# Patient Record
Sex: Female | Born: 1970 | Race: White | Hispanic: No | Smoking: Former smoker
Health system: Southern US, Community
[De-identification: ages and names within clinical notes are randomized; demographics above are authoritative.]

## PROBLEM LIST (undated history)

## (undated) DIAGNOSIS — G8929 Other chronic pain: Secondary | ICD-10-CM

## (undated) DIAGNOSIS — K589 Irritable bowel syndrome without diarrhea: Secondary | ICD-10-CM

## (undated) DIAGNOSIS — R002 Palpitations: Secondary | ICD-10-CM

## (undated) DIAGNOSIS — K3184 Gastroparesis: Secondary | ICD-10-CM

## (undated) DIAGNOSIS — Z8669 Personal history of other diseases of the nervous system and sense organs: Secondary | ICD-10-CM

## (undated) DIAGNOSIS — N2889 Other specified disorders of kidney and ureter: Secondary | ICD-10-CM

## (undated) DIAGNOSIS — M797 Fibromyalgia: Secondary | ICD-10-CM

## (undated) DIAGNOSIS — G459 Transient cerebral ischemic attack, unspecified: Secondary | ICD-10-CM

## (undated) DIAGNOSIS — F419 Anxiety disorder, unspecified: Secondary | ICD-10-CM

## (undated) DIAGNOSIS — F909 Attention-deficit hyperactivity disorder, unspecified type: Secondary | ICD-10-CM

## (undated) HISTORY — DX: Attention-deficit hyperactivity disorder, unspecified type: F90.9

## (undated) HISTORY — DX: Palpitations: R00.2

## (undated) HISTORY — DX: Fibromyalgia: M79.7

## (undated) HISTORY — PX: TUBAL LIGATION: SHX77

## (undated) HISTORY — PX: ABDOMINAL HYSTERECTOMY: SHX81

## (undated) HISTORY — PX: VESICOVAGINAL FISTULA CLOSURE W/ TAH: SUR271

## (undated) HISTORY — DX: Personal history of other diseases of the nervous system and sense organs: Z86.69

## (undated) HISTORY — DX: Gastroparesis: K31.84

## (undated) HISTORY — DX: Irritable bowel syndrome, unspecified: K58.9

## (undated) HISTORY — DX: Other chronic pain: G89.29

---

## 1998-07-11 ENCOUNTER — Encounter: Payer: Self-pay | Admitting: Family Medicine

## 1998-07-11 ENCOUNTER — Ambulatory Visit (HOSPITAL_COMMUNITY): Admission: RE | Admit: 1998-07-11 | Discharge: 1998-07-11 | Payer: Self-pay | Admitting: Family Medicine

## 1998-07-25 ENCOUNTER — Other Ambulatory Visit: Admission: RE | Admit: 1998-07-25 | Discharge: 1998-07-25 | Payer: Self-pay | Admitting: Family Medicine

## 1998-09-06 ENCOUNTER — Encounter: Payer: Self-pay | Admitting: Family Medicine

## 1998-09-06 ENCOUNTER — Ambulatory Visit (HOSPITAL_COMMUNITY): Admission: RE | Admit: 1998-09-06 | Discharge: 1998-09-06 | Payer: Self-pay | Admitting: Family Medicine

## 1998-10-04 ENCOUNTER — Other Ambulatory Visit: Admission: RE | Admit: 1998-10-04 | Discharge: 1998-10-04 | Payer: Self-pay

## 1998-10-14 ENCOUNTER — Encounter: Payer: Self-pay | Admitting: Family Medicine

## 1998-10-14 ENCOUNTER — Ambulatory Visit (HOSPITAL_COMMUNITY): Admission: RE | Admit: 1998-10-14 | Discharge: 1998-10-14 | Payer: Self-pay | Admitting: Family Medicine

## 1998-11-22 ENCOUNTER — Ambulatory Visit (HOSPITAL_COMMUNITY): Admission: RE | Admit: 1998-11-22 | Discharge: 1998-11-22 | Payer: Self-pay | Admitting: Family Medicine

## 1998-11-22 ENCOUNTER — Encounter: Payer: Self-pay | Admitting: Family Medicine

## 1998-12-03 ENCOUNTER — Inpatient Hospital Stay (HOSPITAL_COMMUNITY): Admission: RE | Admit: 1998-12-03 | Discharge: 1998-12-03 | Payer: Self-pay | Admitting: *Deleted

## 1998-12-03 ENCOUNTER — Encounter: Payer: Self-pay | Admitting: Family Medicine

## 1999-01-10 ENCOUNTER — Encounter: Payer: Self-pay | Admitting: Family Medicine

## 1999-01-10 ENCOUNTER — Ambulatory Visit (HOSPITAL_COMMUNITY): Admission: RE | Admit: 1999-01-10 | Discharge: 1999-01-10 | Payer: Self-pay | Admitting: Family Medicine

## 1999-01-13 ENCOUNTER — Inpatient Hospital Stay (HOSPITAL_COMMUNITY): Admission: AD | Admit: 1999-01-13 | Discharge: 1999-01-13 | Payer: Self-pay | Admitting: Family Medicine

## 1999-01-16 ENCOUNTER — Observation Stay (HOSPITAL_COMMUNITY): Admission: AD | Admit: 1999-01-16 | Discharge: 1999-01-17 | Payer: Self-pay | Admitting: Family Medicine

## 1999-01-23 ENCOUNTER — Inpatient Hospital Stay (HOSPITAL_COMMUNITY): Admission: AD | Admit: 1999-01-23 | Discharge: 1999-01-23 | Payer: Self-pay | Admitting: Family Medicine

## 1999-01-26 ENCOUNTER — Inpatient Hospital Stay (HOSPITAL_COMMUNITY): Admission: AD | Admit: 1999-01-26 | Discharge: 1999-01-26 | Payer: Self-pay | Admitting: *Deleted

## 1999-01-29 ENCOUNTER — Inpatient Hospital Stay (HOSPITAL_COMMUNITY): Admission: AD | Admit: 1999-01-29 | Discharge: 1999-02-01 | Payer: Self-pay

## 1999-02-10 ENCOUNTER — Inpatient Hospital Stay (HOSPITAL_COMMUNITY): Admission: AD | Admit: 1999-02-10 | Discharge: 1999-02-10 | Payer: Self-pay | Admitting: Family Medicine

## 1999-02-11 ENCOUNTER — Encounter (HOSPITAL_COMMUNITY): Admission: RE | Admit: 1999-02-11 | Discharge: 1999-05-12 | Payer: Self-pay | Admitting: Family Medicine

## 2004-08-28 ENCOUNTER — Ambulatory Visit: Payer: Self-pay | Admitting: Family Medicine

## 2004-09-03 ENCOUNTER — Ambulatory Visit: Payer: Self-pay | Admitting: Family Medicine

## 2004-10-14 ENCOUNTER — Ambulatory Visit: Payer: Self-pay | Admitting: Family Medicine

## 2004-12-02 ENCOUNTER — Ambulatory Visit: Payer: Self-pay | Admitting: Family Medicine

## 2004-12-15 ENCOUNTER — Ambulatory Visit: Payer: Self-pay | Admitting: Family Medicine

## 2004-12-18 ENCOUNTER — Ambulatory Visit (HOSPITAL_COMMUNITY): Admission: RE | Admit: 2004-12-18 | Discharge: 2004-12-18 | Payer: Self-pay | Admitting: Obstetrics and Gynecology

## 2005-03-04 ENCOUNTER — Ambulatory Visit: Payer: Self-pay | Admitting: Family Medicine

## 2005-04-20 ENCOUNTER — Encounter (INDEPENDENT_AMBULATORY_CARE_PROVIDER_SITE_OTHER): Payer: Self-pay | Admitting: *Deleted

## 2005-04-20 ENCOUNTER — Observation Stay (HOSPITAL_COMMUNITY): Admission: RE | Admit: 2005-04-20 | Discharge: 2005-04-21 | Payer: Self-pay | Admitting: Obstetrics and Gynecology

## 2005-04-26 ENCOUNTER — Inpatient Hospital Stay (HOSPITAL_COMMUNITY): Admission: AD | Admit: 2005-04-26 | Discharge: 2005-04-26 | Payer: Self-pay | Admitting: Obstetrics and Gynecology

## 2005-05-04 ENCOUNTER — Ambulatory Visit: Payer: Self-pay | Admitting: Family Medicine

## 2005-08-10 ENCOUNTER — Ambulatory Visit: Payer: Self-pay | Admitting: Family Medicine

## 2005-10-01 ENCOUNTER — Ambulatory Visit: Payer: Self-pay | Admitting: Family Medicine

## 2006-01-21 ENCOUNTER — Ambulatory Visit: Payer: Self-pay | Admitting: Family Medicine

## 2006-04-27 ENCOUNTER — Ambulatory Visit: Payer: Self-pay | Admitting: Family Medicine

## 2006-05-04 ENCOUNTER — Emergency Department (HOSPITAL_COMMUNITY): Admission: EM | Admit: 2006-05-04 | Discharge: 2006-05-04 | Payer: Self-pay | Admitting: Emergency Medicine

## 2006-06-25 ENCOUNTER — Emergency Department (HOSPITAL_COMMUNITY): Admission: EM | Admit: 2006-06-25 | Discharge: 2006-06-26 | Payer: Self-pay | Admitting: Emergency Medicine

## 2006-07-09 ENCOUNTER — Encounter: Admission: RE | Admit: 2006-07-09 | Discharge: 2006-07-09 | Payer: Self-pay | Admitting: Specialist

## 2006-08-18 ENCOUNTER — Ambulatory Visit: Payer: Self-pay | Admitting: Family Medicine

## 2006-11-25 ENCOUNTER — Ambulatory Visit: Payer: Self-pay | Admitting: Pulmonary Disease

## 2006-12-04 ENCOUNTER — Emergency Department (HOSPITAL_COMMUNITY): Admission: EM | Admit: 2006-12-04 | Discharge: 2006-12-04 | Payer: Self-pay | Admitting: Emergency Medicine

## 2007-01-15 ENCOUNTER — Emergency Department (HOSPITAL_COMMUNITY): Admission: EM | Admit: 2007-01-15 | Discharge: 2007-01-16 | Payer: Self-pay | Admitting: Emergency Medicine

## 2007-01-21 ENCOUNTER — Ambulatory Visit: Payer: Self-pay | Admitting: Pulmonary Disease

## 2007-01-26 ENCOUNTER — Encounter: Admission: RE | Admit: 2007-01-26 | Discharge: 2007-04-26 | Payer: Self-pay | Admitting: Pulmonary Disease

## 2007-02-06 ENCOUNTER — Emergency Department (HOSPITAL_COMMUNITY): Admission: EM | Admit: 2007-02-06 | Discharge: 2007-02-07 | Payer: Self-pay | Admitting: Emergency Medicine

## 2007-02-15 ENCOUNTER — Ambulatory Visit: Payer: Self-pay | Admitting: Pulmonary Disease

## 2007-03-04 ENCOUNTER — Ambulatory Visit (HOSPITAL_COMMUNITY): Admission: RE | Admit: 2007-03-04 | Discharge: 2007-03-04 | Payer: Self-pay | Admitting: Family Medicine

## 2007-03-11 ENCOUNTER — Ambulatory Visit (HOSPITAL_COMMUNITY): Admission: RE | Admit: 2007-03-11 | Discharge: 2007-03-11 | Payer: Self-pay | Admitting: Family Medicine

## 2007-03-21 ENCOUNTER — Ambulatory Visit (HOSPITAL_COMMUNITY): Admission: RE | Admit: 2007-03-21 | Discharge: 2007-03-21 | Payer: Self-pay | Admitting: Gastroenterology

## 2007-07-05 DIAGNOSIS — J45909 Unspecified asthma, uncomplicated: Secondary | ICD-10-CM | POA: Insufficient documentation

## 2007-07-05 DIAGNOSIS — J310 Chronic rhinitis: Secondary | ICD-10-CM | POA: Insufficient documentation

## 2008-03-14 ENCOUNTER — Encounter: Payer: Self-pay | Admitting: Cardiology

## 2008-03-23 ENCOUNTER — Encounter: Payer: Self-pay | Admitting: Cardiology

## 2008-07-27 ENCOUNTER — Encounter: Payer: Self-pay | Admitting: Cardiology

## 2008-09-02 ENCOUNTER — Encounter: Payer: Self-pay | Admitting: Cardiology

## 2008-10-09 ENCOUNTER — Encounter: Payer: Self-pay | Admitting: Cardiology

## 2008-10-17 ENCOUNTER — Emergency Department (HOSPITAL_COMMUNITY): Admission: EM | Admit: 2008-10-17 | Discharge: 2008-10-17 | Payer: Self-pay | Admitting: Emergency Medicine

## 2009-04-30 IMAGING — CT CT ABD-PELV W/O CM
2 of 4 series · 15 of 42 positions shown, 19 images · non-contrast
Comparison: NONE

CLINICAL DATA: Severe abdominal pain and weight loss.  Evaluate 
for  pancreatitis or diverticulitis. 

CT ABDOMEN AND PELVIS WITHOUT INTRAVENOUS OR ORAL CONTRAST
TECHNIQUE: Multiple axial 3 millimeter thick slices at 3 
millimeter increments were obtained from the lung base through the 
pelvis.

[Series 2: wo · axial · 0.65mm/px · z∈[+610,+935]mm · 12 of 75 slices shown, 16 images]
[im 5/75  soft-tissue]
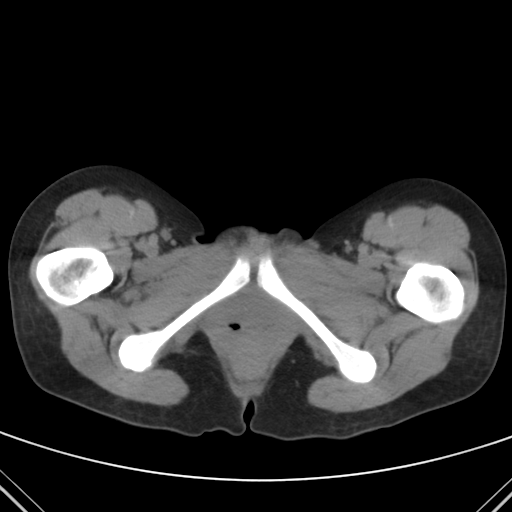
[im 5/75  bone]
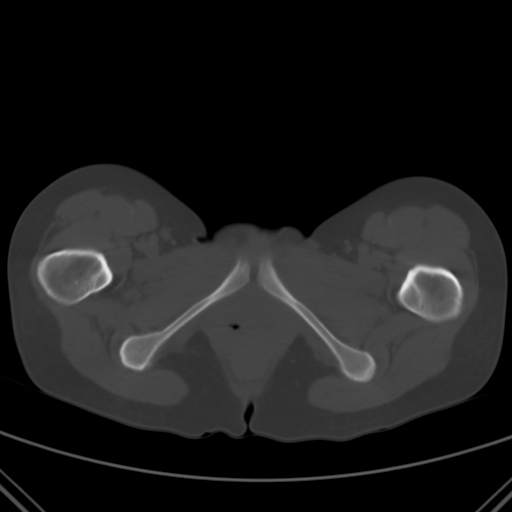
[im 13/75  soft-tissue]
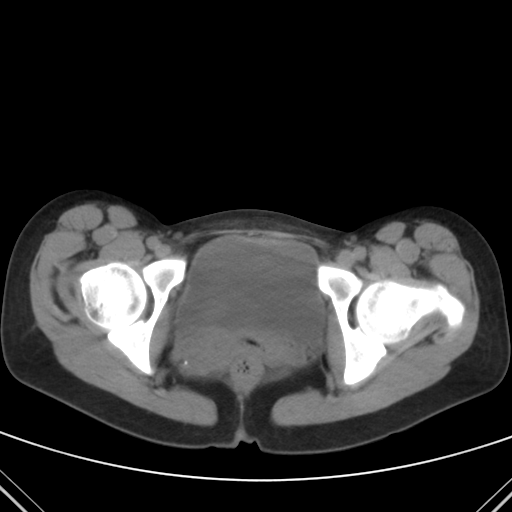
[im 21/75  soft-tissue]
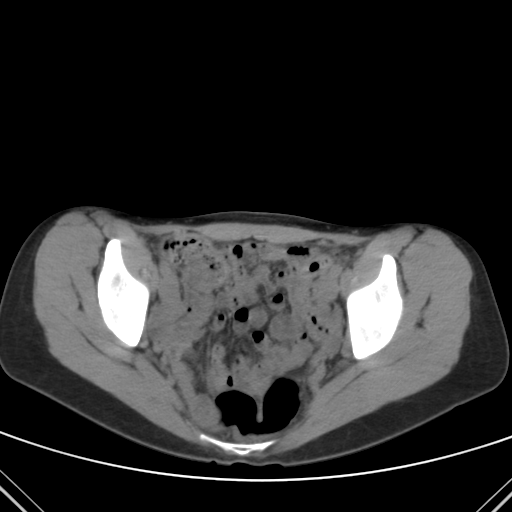
[im 25/75  soft-tissue]
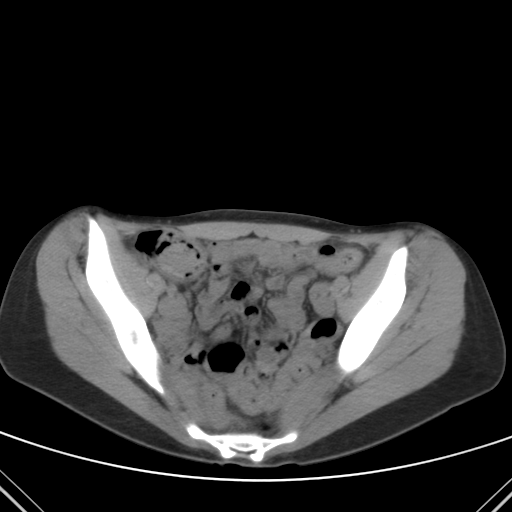
[im 33/75  soft-tissue]
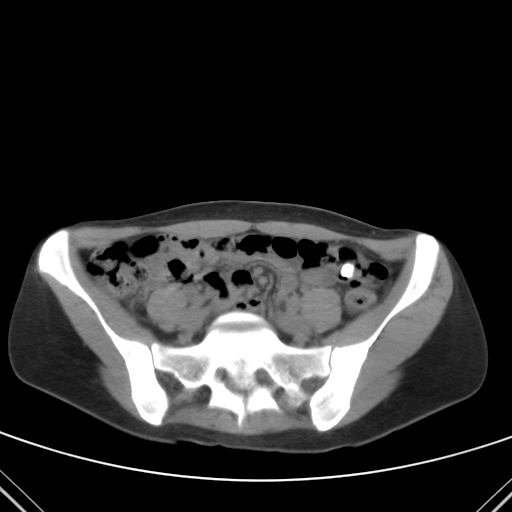
[im 42/75  soft-tissue]
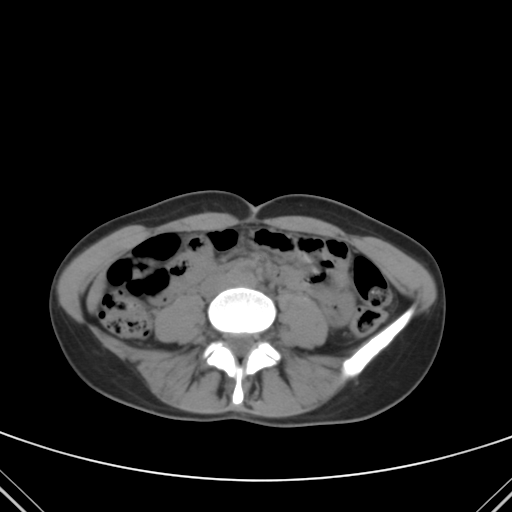
[im 50/75  soft-tissue]
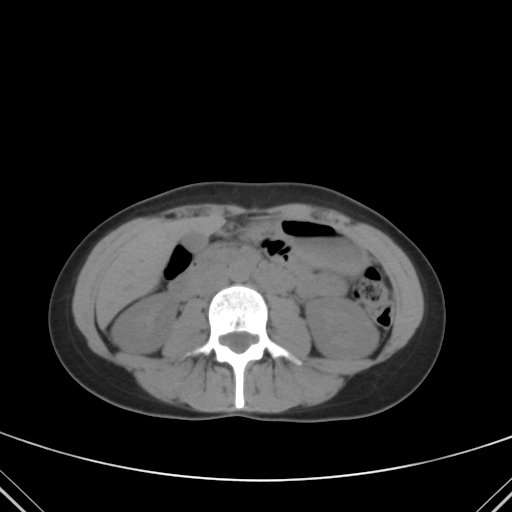
[im 54/75  soft-tissue]
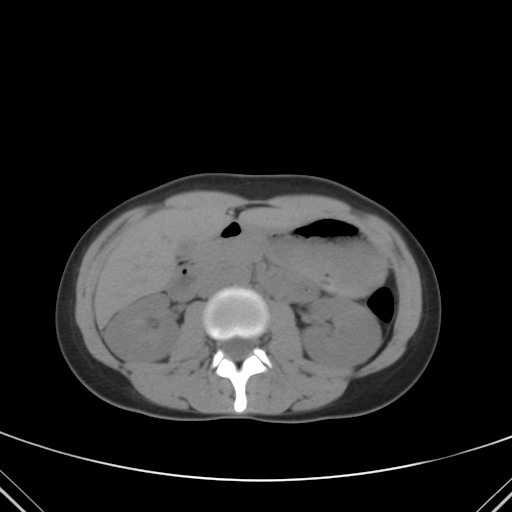
[im 58/75  lung]
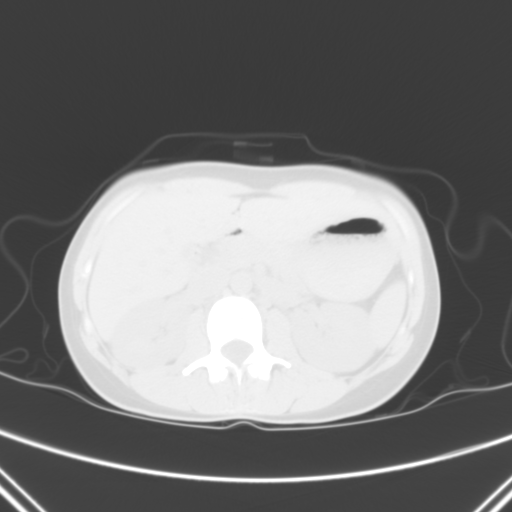
[im 62/75  soft-tissue]
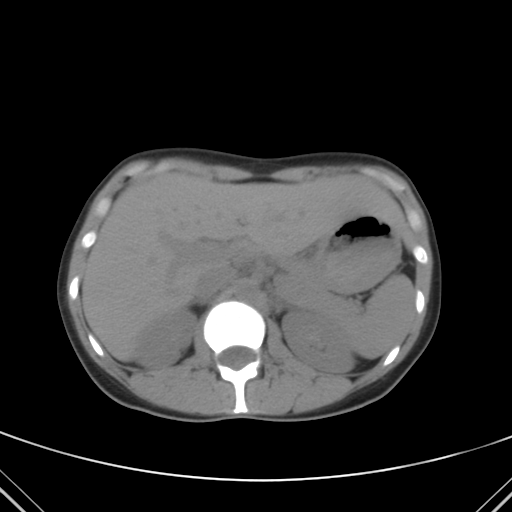
[im 62/75  lung]
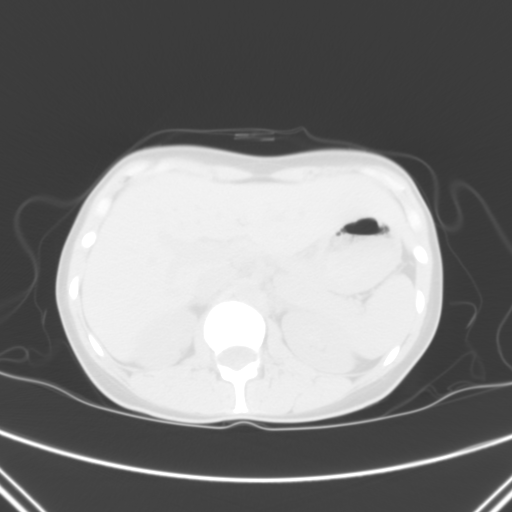
[im 62/75  bone]
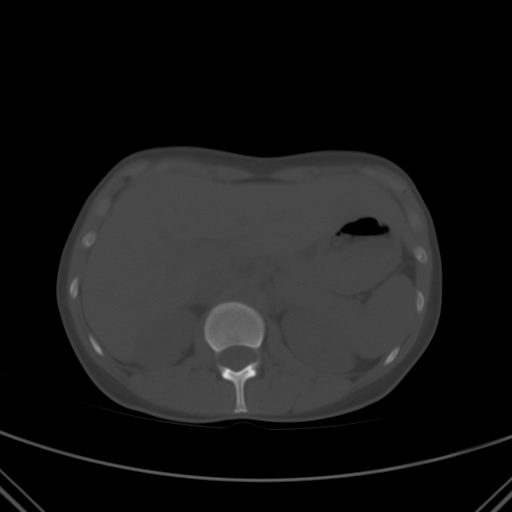
[im 66/75  lung]
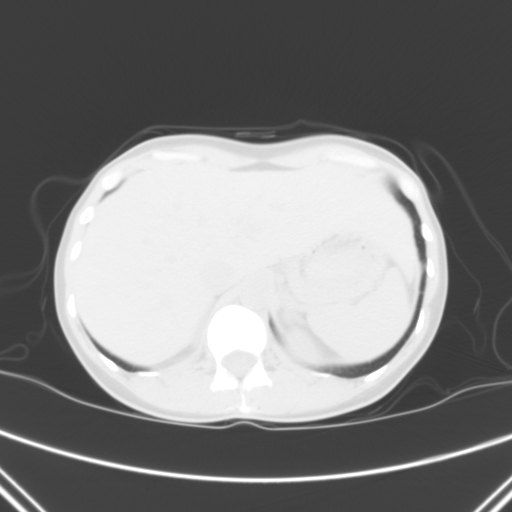
[im 70/75  soft-tissue]
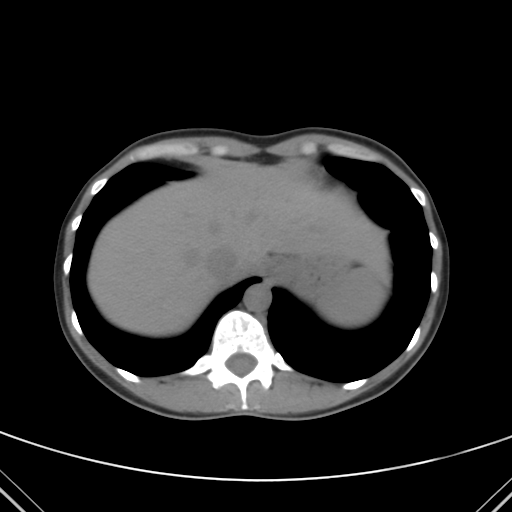
[im 70/75  lung]
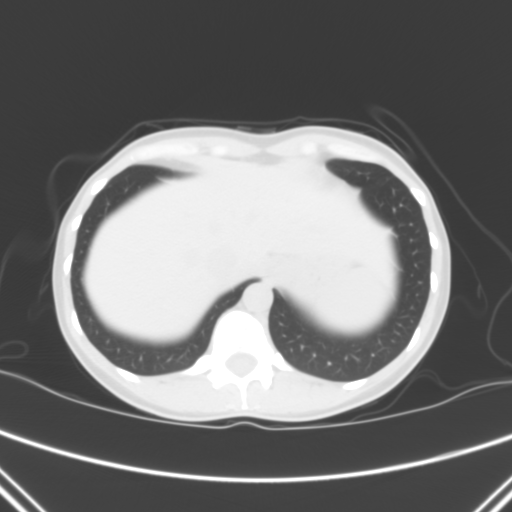

[coronals · coronal · 0.72mm/px · 3 of 62 slices shown]
[im 21/62  soft-tissue]
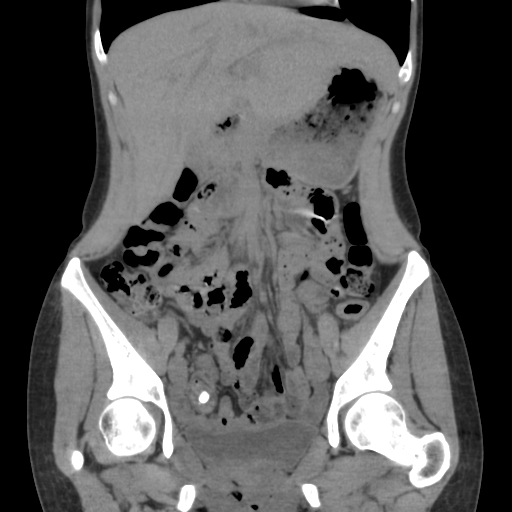
[im 28/62  soft-tissue]
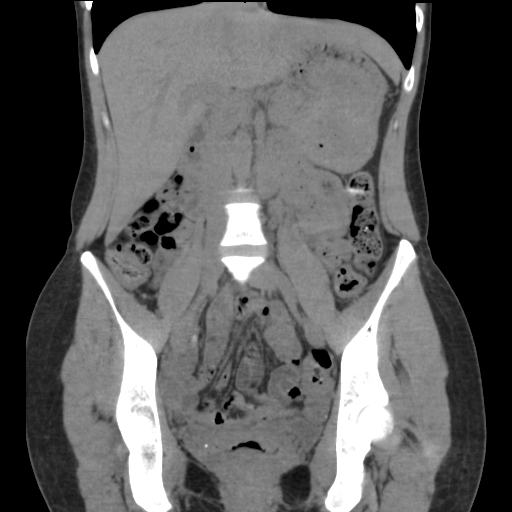
[im 34/62  soft-tissue]
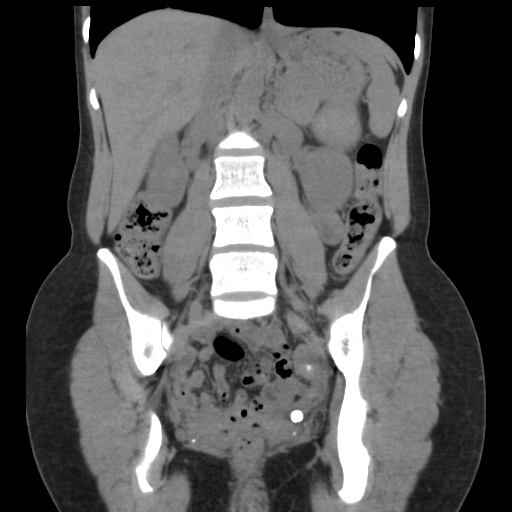

[15 of 42 positions shown; findings below may reference images not displayed]

FINDINGS: The visualized portions of the lung bases are within 
normal limits.  Evaluation of the intraabdominal organs is limited 
without intravenous contrast.  The pancreas, spleen, and liver are 
grossly within normal limits.  There is no evidence of 
hydronephrosis or nephrolithiasis.  The adrenal glands are within 
normal limits.  Gallbladder appears to be partially contracted.  
No gross calculi are seen in the gallbladder lumen.  There are no 
gross intraluminal masses noted in the bladder.  There is no 
evidence of inflammation seen adjacent to the colon.  There are 
multiple oval-shaped radiopaque masses seen that are in the bowel. 
 This may represent pill fragments.  These have not dissolved very 
much.
IMPRESSION: No evidence of pancreatitis or diverticulitis. 
Radiopaque pill fragments are suggested scattered throughout the 
bowel. No inflammatory changes seen in the abdomen or pelvis. 
01/27/2007  Trans Date: 01/27/2007 JH  JLM

## 2009-09-04 ENCOUNTER — Encounter: Payer: Self-pay | Admitting: Cardiology

## 2009-09-30 ENCOUNTER — Encounter: Payer: Self-pay | Admitting: Cardiology

## 2009-09-30 ENCOUNTER — Emergency Department (HOSPITAL_COMMUNITY): Admission: EM | Admit: 2009-09-30 | Discharge: 2009-10-01 | Payer: Self-pay | Admitting: Emergency Medicine

## 2009-10-01 ENCOUNTER — Encounter: Payer: Self-pay | Admitting: Cardiology

## 2009-10-04 ENCOUNTER — Encounter: Payer: Self-pay | Admitting: Cardiology

## 2009-10-22 ENCOUNTER — Encounter: Payer: Self-pay | Admitting: Cardiology

## 2009-10-22 ENCOUNTER — Observation Stay (HOSPITAL_COMMUNITY): Admission: EM | Admit: 2009-10-22 | Discharge: 2009-10-24 | Payer: Self-pay | Admitting: Emergency Medicine

## 2009-10-23 ENCOUNTER — Encounter: Payer: Self-pay | Admitting: Cardiology

## 2009-10-24 ENCOUNTER — Encounter: Payer: Self-pay | Admitting: Cardiology

## 2009-10-24 ENCOUNTER — Encounter (INDEPENDENT_AMBULATORY_CARE_PROVIDER_SITE_OTHER): Payer: Self-pay | Admitting: Internal Medicine

## 2009-10-30 ENCOUNTER — Encounter: Admission: RE | Admit: 2009-10-30 | Discharge: 2009-12-26 | Payer: Self-pay | Admitting: Internal Medicine

## 2009-11-19 ENCOUNTER — Ambulatory Visit: Payer: Self-pay | Admitting: Cardiology

## 2009-11-19 DIAGNOSIS — R000 Tachycardia, unspecified: Secondary | ICD-10-CM | POA: Insufficient documentation

## 2009-11-19 DIAGNOSIS — R072 Precordial pain: Secondary | ICD-10-CM | POA: Insufficient documentation

## 2009-11-19 DIAGNOSIS — R0602 Shortness of breath: Secondary | ICD-10-CM | POA: Insufficient documentation

## 2009-11-19 DIAGNOSIS — E785 Hyperlipidemia, unspecified: Secondary | ICD-10-CM | POA: Insufficient documentation

## 2010-01-06 ENCOUNTER — Encounter: Payer: Self-pay | Admitting: Cardiology

## 2010-01-08 ENCOUNTER — Encounter (INDEPENDENT_AMBULATORY_CARE_PROVIDER_SITE_OTHER): Payer: Self-pay | Admitting: *Deleted

## 2010-01-21 ENCOUNTER — Encounter: Payer: Self-pay | Admitting: Cardiology

## 2010-09-23 NOTE — Letter (Signed)
Summary: Appointment -missed  [facility] at [facility] [address]   [place], [place] [address]   Phone: [phone]  Fax: [phone]     Jan 21, 2010 MRN: [id number]     Nancy Barry [address] [place], [place]  [address]     Dear Nancy Barry,  Our records indicate you missed your appointment on Jan 21, 2010                        with Dr.  Antoine Poche.   It is very important that we reach you to reschedule this appointment. We look forward to participating in your health care needs.   Please contact us at the number listed above at your earliest convenience to reschedule this appointment.   Sincerely,    Glass blower/designer

## 2010-09-23 NOTE — Assessment & Plan Note (Signed)
Summary: NP-PALPS & CHEST PAIN   Visit Type:  Initial Consult Primary Provider:  Dr. Joette Catching  CC:  palpitations.  History of Present Illness: The patient presents for evaluation of tachycardia. She presents a confusing story having been evaluated recently by a rheumatologist. She tells me she was "throwing clots" and having TIAs. However, I reviewed a report of an MRI that demonstrated no evidence of this. She has had a hypercoagulable workup. I have some of these results but not complete panel apparently. She has told his been nothing found but is confused about some of the results. She wanted to come to a cardiologist apparently to complete a hypercoagulable workup. She did have an echocardiogram that demonstrated an ejection fraction of 60-65% with no evidence of wall motion abnormality or valve abnormality. This was done on March 3.  She is somewhat limited by fibromyalgia and cannulated with a cane. She does report shortness of breath which happens with activity such as climbing stairs. She doesn't report PND or orthopnea or resting shortness of breath. She talks about sporadic chest pain it happens at rest. She mentions that her heart is beating hard but not irregular. She'll say that when she does minimal activity will go to 130 and even be in the 120s at rest. She's had no presyncope or syncope. It doesn't sound like she's had classic substernal chest discomfort, neck or arm discomfort. There is not been associated nausea vomiting or diaphoresis.  Preventive Screening-Counseling & Management  Alcohol-Tobacco     Smoking Status: current     Packs/Day: <0.25  Comments: Pt states smokes about 1/5 ppd (trying to quit). She has smoked for about 15 yrs  Current Medications (verified): 1)  Astelin 137 Mcg/spray  Soln (Azelastine Hcl) .... As Needed 2)  Xopenex Hfa 45 Mcg/act  Aero (Levalbuterol Tartrate) .... As Needed 3)  Fioricet/codeine 50-325-40-30 Mg  Caps  (Butalbital-Apap-Caff-Cod) .... As Needed 4)  Avinza 90 Mg Xr24h-Cap (Morphine Sulfate Beads) .... Take 1 Tablet By Mouth Once A Day 5)  Promethazine Hcl 25 Mg Tabs (Promethazine Hcl) .... As Needed 6)  Amitriptyline Hcl 50 Mg Tabs (Amitriptyline Hcl) .... Take 1 Tab By Mouth At Bedtime 7)  Savella 50 Mg Tabs (Milnacipran Hcl) .... Take 1 Tablet By Mouth Two Times A Day 8)  Methocarbamol 750 Mg Tabs (Methocarbamol) .... Take 1 Tablet By Mouth Three Times A Day 9)  Vitamin D3 2000 Unit Caps (Cholecalciferol) .... Take 1 Capsule By Mouth Once A Day 10)  Melatonin 3-6mg  .... Take 1 Tab By Mouth At Bedtime 11)  Multivitamins  Tabs (Multiple Vitamin) .... Take 1 Tablet By Mouth Once A Day 12)  Epipen 0.3 Mg/0.25ml Devi (Epinephrine) .... As Needed Beestings  Allergies: 1)  ! Pcn 2)  ! Sulfa 3)  ! * Latex 4)  ! * Bee Stings  Comments:  Nurse/Medical Assistant: The patient's medications were reviewed with the patient and were updated in the Medication List. Pt brought a list of medications to office visit.  Cyril Loosen, RN, BSN (November 19, 2009 2:00 PM)  Past History:  Past Medical History: Chest pains Palpitations Chronic pain.  History of migraines.  Asthma.  Fibromyalgia.  Gastroparesis.  ADHD.  IBS.   Past Surgical History: Tubal ligation Hysterectomy.      Family History: Significant for migraine in grandfather.  Maternal grandfather MIs x 3 early 77s  Social History: Lives with her mother.  Smoking cigarettes (5 - 6 per day off and on 15  years)  drinking alcohol, using illegal drugs. 60 year old daughter Smoking Status:  current Packs/Day:  <0.25  Review of Systems       Positive for headaches, sweats, fatigue, loss of vision, double vision, weakness, gait and cognitive problems, varicose veins, asthma, wheezing joint pains. Otherwise as stated in the history of present illness negative for other systems  Vital Signs:  Patient profile:   40 year old  female Height:      65 inches Weight:      147.25 pounds BMI:     24.59 Pulse rate:   102 / minute BP sitting:   119 / 79  (right arm) Cuff size:   regular  Vitals Entered By: Cyril Loosen, RN, BSN (November 19, 2009 1:54 PM) CC: palpitations Comments chest pain and palpitations   Physical Exam  General:  Well developed, well nourished, in no acute distress. Head:  normocephalic and atraumatic Eyes:  PERRLA/EOM intact; conjunctiva and lids normal. Mouth:  Teeth, gums and palate normal. Oral mucosa normal. Neck:  Neck supple, no JVD. No masses, thyromegaly or abnormal cervical nodes. Chest Wall:  no deformities or breast masses noted Lungs:  Clear bilaterally to auscultation and percussion. Abdomen:  Bowel sounds positive; abdomen soft and non-tender without masses, organomegaly, or hernias noted. No hepatosplenomegaly. Msk:  Back normal, normal gait. Muscle strength and tone normal. Extremities:  No clubbing or cyanosis. Neurologic:  Alert and oriented x 3. Skin:  Intact without lesions or rashes. Cervical Nodes:  no significant adenopathy Axillary Nodes:  no significant adenopathy Psych:  Normal affect.   Detailed Cardiovascular Exam  Neck    Carotids: Carotids full and equal bilaterally without bruits.      Neck Veins: Normal, no JVD.    Heart    Inspection: no deformities or lifts noted.      Palpation: normal PMI with no thrills palpable.      Auscultation: regular rate and rhythm, S1, S2 without murmurs, rubs, gallops, or clicks.    Vascular    Abdominal Aorta: no palpable masses, pulsations, or audible bruits.      Femoral Pulses: normal femoral pulses bilaterally.      Pedal Pulses: normal pedal pulses bilaterally.      Radial Pulses: normal radial pulses bilaterally.      Peripheral Circulation: no clubbing, cyanosis, or edema noted with normal capillary refill.     New Orders:     1)  [facility]/Event Monitor (Cardionet/Event)   EKG  Procedure date:   10/22/2009  Findings:      normal sinus rhythm, rate 82, axis within normal limits, QT prolonged, no acute ST-T wave changes, no old EKGs for comparison  Impression & Recommendations:  Problem # 1:  TACHYCARDIA (ICD-785.0) The patient does have a rapid heart rate by her report. She has other vague symptoms. She has an abnormal QT. She's had no presyncope or syncope. At this point her QT could be related to medications as she is on a couple that could prolong this interval. I will apply an event monitor to try to capture any symptoms that could be related to tachycardia palpitations or other arrhythmias. Of note she said a normal thyroid profile and normal electrolytes.  Problem # 2:  SHORTNESS OF BREATH (ICD-786.05) Shehad a normal echocardiogram. This is a vague symptom. I would not suggest further cardiovascular evaluation at this point. Orders: Cardionet/Event Monitor (Cardionet/Event)  Problem # 3:  PRECORDIAL PAIN (ICD-786.51) She has no symptoms consistent with unstable angina  or cardiac etiology. She has no significant risk factors. I would suspect a non-anginal etiology. Of note she questioned a hypercoagulable workup which she has seen a rheumatologist and had extensive blood work. I asked her to review this with that rheumatologist. Orders: Cardionet/Event Monitor (Cardionet/Event)  Problem # 4:  DYSLIPIDEMIA (ICD-272.4) I reviewed a lipid profile. Her LDL was 167. However, her HDL was in the 70s. I discussed further dietary restrictions and suggested that she have this followed up in several months. At this time these guidelines suggestions for this level of LDL.  Patient Instructions: 1)  Your physician has recommended that you wear an event monitor.  Event monitors are medical devices that record the heart's electrical activity. Doctors most often use these monitors to diagnose arrhythmias. Arrhythmias are problems with the speed or rhythm of the heartbeat. The monitor is a  small, portable device. You can wear one while you do your normal daily activities. This is usually used to diagnose what is causing palpitations/syncope (passing out). 2)  Your physician recommends that you schedule a follow-up appointment in: MAY 31ST 2011 @9 :00AM WTIH DR. Merlinda Wrubel.  Appended Document: NP-PALPS & CHEST PAIN Received a call from [employer] stating that [facility] Heart Event monitor is back ordered and will be a 5-7 day delay. She will notify pt. She also stated they do have solutions in place to help pt obtain monitor ASAP.

## 2010-09-23 NOTE — Letter (Signed)
Summary: Engineer, materials at [facility]  [address]   [place], [place] [address]   Phone: [phone]  Fax: [phone]        Jan 08, 2010 MRN: [id number]    [name] [address] [place] [address]    Dear [name],  Your test ordered by Selena Batten has been reviewed by your physician (or physician assistant) and was found to be normal or stable. Your physician (or physician assistant) felt no changes were needed at this time.  ____ Echocardiogram  ____ Cardiac Stress Test  ____ Lab Work  ____ Peripheral vascular study of arms, legs or neck  ____ CT scan or X-ray  ____ Lung or Breathing test  _X_ Other: heart monitor   Thank you.   Cyril Loosen, RN, BSN    Duane Boston, M.D., F.A.C.C. Thressa Sheller, M.D., F.A.C.C. Oneal Grout, M.D., F.A.C.C. Cheree Ditto, M.D., F.A.C.C. Daiva Nakayama, M.D., F.A.C.C. Kenney Houseman, M.D., F.A.C.C. Jeanne Ivan, PA-C  Appended Document: [facility] Results Letters Pt notified of results by letter.

## 2010-09-23 NOTE — Medication Information (Signed)
Summary: MEDICATION LIST PER [facility]  MEDICATION LIST PER [facility]   Imported By: Claudette Laws 10/30/2009 15:40:45  _____________________________________________________________________  External Attachment:    Type:   Image     Comment:   External Document

## 2010-09-23 NOTE — Letter (Signed)
Summary: Internal Other/ PATIENT HISTORY FORM  Internal Other/ PATIENT HISTORY FORM   Imported By: Dorise Hiss 11/20/2009 08:32:30  _____________________________________________________________________  External Attachment:    Type:   Image     Comment:   External Document

## 2010-09-23 NOTE — Letter (Signed)
Summary: Discharge Summary  Discharge Summary   Imported By: Dorise Hiss 11/19/2009 12:12:32  _____________________________________________________________________  External Attachment:    Type:   Image     Comment:   External Document

## 2010-09-23 NOTE — Consult Note (Signed)
Summary: Consultation Report  Consultation Report   Imported By: Dorise Hiss 11/19/2009 12:14:23  _____________________________________________________________________  External Attachment:    Type:   Image     Comment:   External Document

## 2010-09-23 NOTE — Procedures (Signed)
Summary: Event Monitor Final Summary  Event Monitor Final Summary   Imported By: Cyril Loosen, RN, BSN 01/08/2010 10:53:57  _____________________________________________________________________  External Attachment:    Type:   Image     Comment:   External Document

## 2010-11-12 LAB — BASIC METABOLIC PANEL
Calcium: 9.2 mg/dL (ref 8.4–10.5)
Chloride: 104 mEq/L (ref 96–112)
Creatinine, Ser: 0.7 mg/dL (ref 0.4–1.2)
GFR calc Af Amer: >60 mL/min (ref >60)
Sodium: 137 mEq/L (ref 135–145)

## 2010-11-12 LAB — PROTIME-INR
INR: 1.04 (ref 0.00–1.49)
Prothrombin Time: 13.5 seconds (ref 11.6–15.2)

## 2010-11-12 LAB — GRAM STAIN

## 2010-11-12 LAB — CSF CELL COUNT WITH DIFFERENTIAL
Tube #: 1
WBC, CSF: 0 /mm3 (ref 0–5)

## 2010-11-12 LAB — CBC
Hemoglobin: 13.1 g/dL (ref 12.0–15.0)
MCV: 89.2 fL (ref 78.0–100.0)
Platelets: 168 10*3/uL (ref 150–400)
RBC: 4.29 MIL/uL (ref 3.87–5.11)
WBC: 13.5 10*3/uL — ABNORMAL HIGH (ref 4.0–10.5)
WBC: 10.7 10*3/uL — ABNORMAL HIGH (ref 4.0–10.5)

## 2010-11-12 LAB — DIFFERENTIAL
Basophils Absolute: 0.1 10*3/uL (ref 0.0–0.1)
Basophils Relative: 1 % (ref 0–1)
Eosinophils Absolute: 0 10*3/uL (ref 0.0–0.7)
Lymphocytes Relative: 31 % (ref 12–46)
Lymphocytes Relative: 12 % (ref 12–46)
Lymphs Abs: 1.2 10*3/uL (ref 0.7–4.0)
Neutro Abs: 8.1 10*3/uL — ABNORMAL HIGH (ref 1.7–7.7)
Neutro Abs: 8.8 10*3/uL — ABNORMAL HIGH (ref 1.7–7.7)
Neutrophils Relative %: 60 % (ref 43–77)
Neutrophils Relative %: 82 % — ABNORMAL HIGH (ref 43–77)

## 2010-11-12 LAB — D-DIMER, QUANTITATIVE: D-Dimer, Quant: 0.5 ug/mL-FEU — ABNORMAL HIGH (ref 0.00–0.48)

## 2010-11-12 LAB — POCT I-STAT, CHEM 8
BUN: 12 mg/dL (ref 6–23)
Calcium, Ion: 1.1 mmol/L — ABNORMAL LOW (ref 1.12–1.32)
Chloride: 104 mEq/L (ref 96–112)
Glucose, Bld: 95 mg/dL (ref 70–99)

## 2010-11-12 LAB — URINALYSIS, ROUTINE W REFLEX MICROSCOPIC
Glucose, UA: NEGATIVE mg/dL
Hgb urine dipstick: NEGATIVE
Protein, ur: NEGATIVE mg/dL
Specific Gravity, Urine: 1.028 (ref 1.005–1.030)
Urobilinogen, UA: 0.2 mg/dL (ref 0.0–1.0)

## 2010-11-12 LAB — PROTEIN AND GLUCOSE, CSF: Glucose, CSF: 67 mg/dL (ref 43–76)

## 2010-11-12 LAB — CK TOTAL AND CKMB (NOT AT ARMC)
CK, MB: 1.8 ng/mL (ref 0.3–4.0)
Relative Index: 1.5 (ref 0.0–2.5)
Total CK: 119 U/L (ref 7–177)

## 2010-11-12 LAB — CSF CULTURE W GRAM STAIN: Culture: NO GROWTH

## 2010-11-12 LAB — TROPONIN I: Troponin I: 0.01 ng/mL (ref 0.00–0.06)

## 2010-11-17 LAB — COMPREHENSIVE METABOLIC PANEL
Albumin: 3.4 g/dL — ABNORMAL LOW (ref 3.5–5.2)
Alkaline Phosphatase: 76 U/L (ref 39–117)
BUN: 11 mg/dL (ref 6–23)
Chloride: 104 mEq/L (ref 96–112)
Glucose, Bld: 96 mg/dL (ref 70–99)
Potassium: 3.7 mEq/L (ref 3.5–5.1)
Total Bilirubin: 0.4 mg/dL (ref 0.3–1.2)

## 2010-11-17 LAB — CARDIAC PANEL(CRET KIN+CKTOT+MB+TROPI)
CK, MB: 1 ng/mL (ref 0.3–4.0)
Total CK: 86 U/L (ref 7–177)

## 2010-11-17 LAB — DIFFERENTIAL
Basophils Absolute: 0.1 10*3/uL (ref 0.0–0.1)
Basophils Relative: 1 % (ref 0–1)
Eosinophils Absolute: 0.4 10*3/uL (ref 0.0–0.7)
Monocytes Absolute: 0.6 10*3/uL (ref 0.1–1.0)
Neutro Abs: 4.2 10*3/uL (ref 1.7–7.7)
Neutrophils Relative %: 43 % (ref 43–77)

## 2010-11-17 LAB — ANTIPHOSPHOLIPID SYNDROME EVAL, BLD
Anticardiolipin IgA: <3 APL U/mL — ABNORMAL LOW (ref <10)
DRVVT: 49.1 secs — ABNORMAL HIGH (ref 36.2–44.3)
PTT Lupus Anticoagulant: 50.6 secs — ABNORMAL HIGH (ref 32.0–43.4)
PTTLA Confirmation: 1.7 secs (ref <8.0)
Phosphatydalserine, IgG: <10 U/mL (ref <10)

## 2010-11-17 LAB — TSH: TSH: 1.182 u[IU]/mL (ref 0.350–4.500)

## 2010-11-17 LAB — CBC
HCT: 31.3 % — ABNORMAL LOW (ref 36.0–46.0)
HCT: 32.8 % — ABNORMAL LOW (ref 36.0–46.0)
Hemoglobin: 11.1 g/dL — ABNORMAL LOW (ref 12.0–15.0)
Hemoglobin: 11.6 g/dL — ABNORMAL LOW (ref 12.0–15.0)
MCHC: 35.3 g/dL (ref 30.0–36.0)
RDW: 12.7 % (ref 11.5–15.5)
WBC: 9.2 10*3/uL (ref 4.0–10.5)

## 2010-11-17 LAB — HEMOGLOBIN A1C: Hgb A1c MFr Bld: 6 % (ref 4.6–6.1)

## 2010-11-17 LAB — VITAMIN D 1,25 DIHYDROXY
Vitamin D 1, 25 (OH)2 Total: 29 pg/mL (ref 18–72)
Vitamin D2 1, 25 (OH)2: <8 pg/mL
Vitamin D3 1, 25 (OH)2: 29 pg/mL

## 2010-11-17 LAB — RETICULOCYTES
RBC.: 3.95 MIL/uL (ref 3.87–5.11)
Retic Count, Absolute: 51.4 10*3/uL (ref 19.0–186.0)
Retic Ct Pct: 1.3 % (ref 0.4–3.1)

## 2010-11-17 LAB — BASIC METABOLIC PANEL
GFR calc non Af Amer: >60 mL/min (ref >60)
Potassium: 4.2 mEq/L (ref 3.5–5.1)
Sodium: 134 mEq/L — ABNORMAL LOW (ref 135–145)

## 2010-11-17 LAB — IRON AND TIBC: Saturation Ratios: 26 % (ref 20–55)

## 2010-11-17 LAB — HIGH SENSITIVITY CRP: CRP, High Sensitivity: 12.9 mg/L — ABNORMAL HIGH

## 2010-11-17 LAB — C4 COMPLEMENT: Complement C4, Body Fluid: 25 mg/dL (ref 16–47)

## 2010-11-17 LAB — T-HELPER CELLS (CD4) COUNT (NOT AT ARMC): CD4 % Helper T Cell: 54 % (ref 33–55)

## 2010-11-17 LAB — LIPID PANEL: HDL: 37 mg/dL — ABNORMAL LOW (ref >39)

## 2010-11-17 LAB — PROTEIN S ACTIVITY: Protein S Activity: 87 % (ref 69–129)

## 2011-01-06 NOTE — Assessment & Plan Note (Signed)
[facility]                             PULMONARY OFFICE NOTE   Nancy, Barry                         MRN:          [id number]  DATE:01/21/2007                            DOB:          15-Jun-1971    I saw Nancy Barry today in followup for her asthma and rhinitis.   Since her last visit, she has been seen in the emergency room twice,  once because she had what appeared to be an allergic reaction to a bee  sting, the second time was because she had a pill stuck in her throat.  She also says that since her last visit, she has made significant  changes in her diet.  She says that she has cut out all types of  chemicals, as well as wheat, dairy and salicylates, but, as a result,  her diet is somewhat limited, and she has actually lost approximately 6  pounds since her last visit.  She says she also has been having  intermittent episodes of diarrhea with abdominal pain and nausea.  She  also has been having intermittent episodes of thrush which she says has  improved since she started using acidophilus.  She says, however, that  her breathing has improved considerably since she had the dietary  modifications.  Of note, also, is that she did stop smoking  approximately 1 month prior to her dietary modifications.  She, however,  did not undergo the pulmonary function tests or lab tests as I had  requested at her last visit.   CURRENT MEDICATIONS:  1. Singulair 10 mg nightly.  2. Adderall 15 mg b.i.d.  3. Claritin D once daily.  4. Acidophilus twice daily.  5. Xopenex as needed which she has only used once or twice in the last      1 month.   PHYSICAL EXAMINATION:  She is 103 pounds, temperature 99.1, blood  pressure 114/80, heart rate is 108, oxygen saturation is 98% on room  air.  HEENT:  There is no sinus tenderness, there is no nasal discharge, there  is no oral lesions.  She had 2+ tonsils.  There is no lymphadenopathy.  HEART:  S1, S2.  CHEST:  No wheezing or rales.  ABDOMEN:  Thin, soft, nontender.  EXTREMITIES:  No edema.   IMPRESSION:  Asthma which appears to have improved symptomatically.  I  am not completely sure if this is related to her smoking cessation, or  if it is related to her dietary modifications, or a combination of both.  I am concerned about her weight loss, as well as her complaints of  intermittent diarrhea.  Additionally, I am concerned that she may not be  consuming an adequate diet.  I will, therefore, make arrangements for  her to be evaluated by a gastroenterologist and a nutritionist.  I would  also like for her to undergo repeat pulmonary function tests.  I do not  feel that she would need to have any changes in her asthma medications  at the present time.  Additionally, I would defer having her undergo  laboratory tests until she is evaluated by both the nutritionist and the  gastroenterologist.   I will follow up with her in approximately 3 months.     Coralyn Helling, MD  Electronically Signed    VS/MedQ  DD: 01/21/2007  DT: 01/21/2007  Job #: [id number]   cc:   Dario Guardian, M.D.

## 2011-01-09 NOTE — Op Note (Signed)
Nancy Barry, Nancy Barry                  ACCOUNT NO.:  [id number]   MEDICAL RECORD NO.:  [id number]          PATIENT TYPE:  OBV   LOCATION:  9399                          FACILITY:  [facility]   PHYSICIAN:  Duke Salvia. Marcelle Overlie, M.D.DATE OF BIRTH:  May 29, 1971   DATE OF PROCEDURE:  04/20/2005  DATE OF DISCHARGE:                                 OPERATIVE REPORT   PREOPERATIVE DIAGNOSIS:  Menorrhagia.   POSTOPERATIVE DIAGNOSIS:  Menorrhagia.   PROCEDURE:  LAVH.   SURGEON:  Dr. Marcelle Overlie   ASSISTANT:  Dr. Rana Snare   ANESTHESIA:  General endotracheal.   COMPLICATIONS:  None.   DRAINS:  Foley catheter.   BLOOD LOSS:  300.   PROCEDURE AND FINDINGS:  The patient sent to the operating room and after an  adequate level of general endotracheal anesthesia was obtained with the  patient's legs in stirrups, the abdomen, perineum, and vagina were prepped  and draped in the usual manner for LAVH.  The bladder was drained, EUA  carried out.  Uterus mid position, normal size, mobile, adnexa negative.  Hulka tenaculum was positioned and attention directed to the abdomen where a  2 cm subumbilical incision was made after infiltrating with 0.5% Marcaine  plain.  The Veress needle was introduced without difficulty.  Its intra-  abdominal position was verified by pressure and water testing.  After a 2.5  L pneumoperitoneum was then created, a laparoscopic trocar and sleeve were  then introduced without difficulty.  There was no evidence of any bleeding  or trauma.  Three fingerbreadths above the symphysis in the midline a 5 mm  trocar was inserted without difficulty.  Pelvic findings revealed the  bilateral adnexa areas were unremarkable.  The uterus itself was normal  size, mobile.  Anterior and posterior cul-de-sac spaces were free and clear.  The upper abdomen was negative.  The Gyrus bipolar instrument was introduced  to coagulate and cut the uteroovarian pedicle on each side down to and  including the round  ligament with excellent hemostasis.  Attention directed  to the vaginal portion of the procedure.  Legs were extended.  A weighted  speculum was positioned, cervix grasped with a tenaculum.  Cervical vaginal  mucosa was incised, posterior culdotomy performed without difficulty.  The  bladder was advanced superiorly until the peritoneum could be identified.  This was entered sharply and a retractor used to gently elevate the bladder  out of the field.  The Gyrus hand-held instrument was then used to clamp,  coagulate, and cut the uterosacral ligaments, the cardinal ligament, and  upper broad ligament, uterine vasculature pedicle and upper broad ligament  pedicles with excellent hemostasis.  The fundus of the uterus was then  delivered posteriorly, the remaining pedicles coagulated and cut.  The cuff  was then closed from 3 to 9 o'clock with a 2-0 Vicryl locked suture.  McCall's culdoplasty suture was then placed from uterosacral, picking up the  posterior peritoneum and across to the uterosacral ligament and tied down.  This was a 2-0 Vicryl.  Prior to closure, _sponge, needle, and instrument  counts  were reported as correct x2.  The vaginal mucosa was then closed  right to left with interrupted 2-0 Monocryl sutures with excellent  hemostasis.  Foley catheter was positioned draining clear urine.  Attention  directed to the abdomen where the pelvis was irrigated with saline and  aspirated.  After careful inspection and reducing the intra-abdominal  pressure, there was excellent hemostasis.  Instruments were removed, gas  allowed to escape, deep fascia closed with 4-0 Dexon subcuticular sutures  and Dermabond.  She tolerated this well, went to recovery room in good  condition.      Richard M. Marcelle Overlie, M.D.  Electronically Signed     RMH/MEDQ  D:  04/20/2005  T:  04/20/2005  Job:  [id number]

## 2011-01-09 NOTE — Assessment & Plan Note (Signed)
[facility]                                 ON-CALL NOTE   Nancy Barry, Nancy Barry                         MRN:          [id number]  DATE:11/30/2006                            DOB:          May 24, 1971    PHONE:  #[phone]   DATE OF BIRTH:  Jan 07, 1971.   DATE OF PHONE CONVERSATION:  November 30, 2006, at 3:40 a.m.   This patient contacted me on call, for evaluation for asthma.  She was  able to talk in full sentences but saying that she was not getting  relief from all of her asthma medicines, which included both nebulizer  and albuterol.  When I asked her specifically what was not better, she  said she could not stop coughing.  I recommended that she try Delsym  over-the-counter ot Mucinex DM and then contact the office in the  morning to see our nurse practitioner if Dr. Coralyn Helling did not have  any room on the schedule.  I cautioned her that if her condition  worsened, she should go to the emergency room.     Charlaine Dalton. Sherene Sires, MD, [facility]  Electronically Signed    MBW/MedQ  DD: 12/01/2006  DT: 12/01/2006  Job #: [id number]   cc:   Coralyn Helling, MD

## 2011-01-09 NOTE — H&P (Signed)
Nancy Barry                  ACCOUNT NO.:  [id number]   MEDICAL RECORD NO.:  [id number]          PATIENT TYPE:  OBV   LOCATION:  9399                          FACILITY:  [facility]   PHYSICIAN:  Duke Salvia. Marcelle Overlie, M.D.DATE OF BIRTH:  [birth year]   DATE OF ADMISSION:  04/20/2005  DATE OF DISCHARGE:                                HISTORY & PHYSICAL   CHIEF COMPLAINT:  Menorrhagia.   HISTORY OF PRESENT ILLNESS:  A 40 year old G1, P1 who ultrasound SHG back in  the spring for evaluation of irregular bleeding and dyspareunia.  SHG looked  normal at that time.  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  We discussed a number of options including tubal with NovaSure  EMA, trial of OCPs.  She had a preference for hysterectomy.  Initially was  desires of LSH, but on further discussion preferred LAVH.  This procedure  including risks of bleeding, infection, transfusion, adjacent organ injury  with possible need for open or additional surgery all reviewed with her.  She plans to preserve her normal ovaries which will be evaluated at the time  of surgery.   PAST MEDICAL HISTORY:   ALLERGIES:  SULFA and PENICILLIN.   CURRENT MEDICATIONS:  Fiorinal p.r.n., Phenergan p.r.n., albuterol inhaler  p.r.n.   PAST SURGICAL HISTORY:  She has had diagnostic laparoscopy.   PAST OBSTETRICAL HISTORY:  One vaginal delivery in 2000.   REVIEW OF SYSTEMS:  Significant for allergy-related asthma, history of UTI  and migraine, thyroid imbalance during her pregnancy.  She smokes one-half  PPD.   FAMILY HISTORY:  Significant for mother with diabetes and uterine cancer.   PHYSICAL EXAMINATION:  VITAL SIGNS:  Temperature 98.2, blood pressure  120/78.  HEENT:  Unremarkable.  NECK:  Supple without masses.  LUNGS:  Clear.  CARDIOVASCULAR:  Regular rate and rhythm without murmurs, rubs, or gallops  noted.  BREASTS:  Without masses.  ABDOMEN:  Soft, flat, nontender.  PELVIC:  Normal  external genitalia. Vagina and cervix clear.  Uterus mid  position, normal size.  Adnexa negative.  EXTREMITIES:  Unremarkable.  NEUROLOGIC:  Unremarkable.   IMPRESSION:  Dyspareunia and menorrhagia.   PLAN:  LAVH.  Procedure and risks reviewed as above.      Richard M. Marcelle Overlie, M.D.  Electronically Signed     RMH/MEDQ  D:  04/20/2005  T:  04/20/2005  Job:  [id number]
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
## 2011-02-02 ENCOUNTER — Emergency Department (HOSPITAL_COMMUNITY)
Admission: EM | Admit: 2011-02-02 | Discharge: 2011-02-02 | Disposition: A | Discharge status: Home / Self Care | Payer: BC Managed Care – PPO | Attending: Emergency Medicine | Admitting: Emergency Medicine

## 2011-02-02 DIAGNOSIS — R51 Headache: Secondary | ICD-10-CM | POA: Insufficient documentation

## 2011-02-02 DIAGNOSIS — R112 Nausea with vomiting, unspecified: Secondary | ICD-10-CM | POA: Insufficient documentation

## 2011-02-24 ENCOUNTER — Encounter: Payer: Self-pay | Admitting: Cardiology

## 2011-06-10 LAB — BASIC METABOLIC PANEL
BUN: 9
CO2: 25
Calcium: 9
Creatinine, Ser: 0.83
Glucose, Bld: 91
Sodium: 139

## 2011-06-10 LAB — DIFFERENTIAL
Basophils Absolute: 0.1
Basophils Relative: 1
Monocytes Absolute: 0.6
Neutro Abs: 2.5

## 2011-06-10 LAB — URINALYSIS, ROUTINE W REFLEX MICROSCOPIC
Bilirubin Urine: NEGATIVE
Hgb urine dipstick: NEGATIVE
Ketones, ur: NEGATIVE
Protein, ur: NEGATIVE
Urobilinogen, UA: 0.2

## 2011-06-10 LAB — CBC
Hemoglobin: 12.2
MCHC: 33.7
RDW: 12.2

## 2013-04-07 ENCOUNTER — Emergency Department (HOSPITAL_COMMUNITY)
Admission: EM | Admit: 2013-04-07 | Discharge: 2013-04-07 | Disposition: A | Discharge status: Home / Self Care | Payer: Medicare Other | Attending: Emergency Medicine | Admitting: Emergency Medicine

## 2013-04-07 ENCOUNTER — Encounter (HOSPITAL_COMMUNITY): Payer: Self-pay | Admitting: Emergency Medicine

## 2013-04-07 DIAGNOSIS — Z8673 Personal history of transient ischemic attack (TIA), and cerebral infarction without residual deficits: Secondary | ICD-10-CM | POA: Insufficient documentation

## 2013-04-07 DIAGNOSIS — F909 Attention-deficit hyperactivity disorder, unspecified type: Secondary | ICD-10-CM | POA: Insufficient documentation

## 2013-04-07 DIAGNOSIS — G8929 Other chronic pain: Secondary | ICD-10-CM | POA: Insufficient documentation

## 2013-04-07 DIAGNOSIS — K649 Unspecified hemorrhoids: Secondary | ICD-10-CM | POA: Insufficient documentation

## 2013-04-07 DIAGNOSIS — K5900 Constipation, unspecified: Secondary | ICD-10-CM | POA: Insufficient documentation

## 2013-04-07 DIAGNOSIS — G43909 Migraine, unspecified, not intractable, without status migrainosus: Secondary | ICD-10-CM | POA: Insufficient documentation

## 2013-04-07 DIAGNOSIS — Z8719 Personal history of other diseases of the digestive system: Secondary | ICD-10-CM | POA: Insufficient documentation

## 2013-04-07 DIAGNOSIS — Z87448 Personal history of other diseases of urinary system: Secondary | ICD-10-CM | POA: Insufficient documentation

## 2013-04-07 DIAGNOSIS — J45909 Unspecified asthma, uncomplicated: Secondary | ICD-10-CM | POA: Insufficient documentation

## 2013-04-07 DIAGNOSIS — IMO0001 Reserved for inherently not codable concepts without codable children: Secondary | ICD-10-CM | POA: Insufficient documentation

## 2013-04-07 DIAGNOSIS — Z7982 Long term (current) use of aspirin: Secondary | ICD-10-CM | POA: Insufficient documentation

## 2013-04-07 DIAGNOSIS — Z87891 Personal history of nicotine dependence: Secondary | ICD-10-CM | POA: Insufficient documentation

## 2013-04-07 DIAGNOSIS — Z79899 Other long term (current) drug therapy: Secondary | ICD-10-CM | POA: Insufficient documentation

## 2013-04-07 DIAGNOSIS — Z791 Long term (current) use of non-steroidal anti-inflammatories (NSAID): Secondary | ICD-10-CM | POA: Insufficient documentation

## 2013-04-07 DIAGNOSIS — H9209 Otalgia, unspecified ear: Secondary | ICD-10-CM | POA: Insufficient documentation

## 2013-04-07 HISTORY — DX: Anxiety disorder, unspecified: F41.9

## 2013-04-07 HISTORY — DX: Transient cerebral ischemic attack, unspecified: G45.9

## 2013-04-07 HISTORY — DX: Other specified disorders of kidney and ureter: N28.89

## 2013-04-07 NOTE — ED Notes (Signed)
Pt states while placing a suppository for migraine yesterday she felt a mass in her rectum, pt states she used suppository 3 days prior and did not feel mass. Denies pain or bleeding.

## 2013-04-07 NOTE — ED Provider Notes (Signed)
CSN: [id number]     Arrival date & time 04/07/13  2103 History     First MD Initiated Contact with Patient 04/07/13 2314     Chief Complaint  Patient presents with  . Anal Mass    HPI  History provided by the patient. The patient is a 42 year old female with history of chronic pains and migraines who presents with complaints of anal mass. Patient states she was using the suppository Phenergan 3 days ago did not notice any kind of mass or swelling around her anal area. Today the patient again was having a headache and began to use suppository Phenergan and noticed a large area of swelling. She states area was nontender. She denies having any itching to the area. She reports a past history of hemorrhoids during pregnancy but states those were very tender at that time and this does not hurt. She has not had any bleeding. She does report constipation that has been an issue related to her chronic pain. She does use Senokot stool softener. This has not helped significantly. Denies any other associated symptoms. No other aggravating or alleviating factors.    Past Medical History  Diagnosis Date  . Chest pain   . Palpitations   . Chronic pain   . History of migraines   . Asthma   . Fibromyalgia   . Gastroparesis   . ADHD (attention deficit hyperactivity disorder)   . IBS (irritable bowel syndrome)   . TIA (transient ischemic attack)   . Anxiety   . Kidney mass     Left   Past Surgical History  Procedure Laterality Date  . Tubal ligation    . Vesicovaginal fistula closure w/ tah    . Abdominal hysterectomy     No family history on file. History  Substance Use Topics  . Smoking status: Former Games developer  . Smokeless tobacco: Not on file     Comment: Smoking cigarettes (5-6 per day off and on 15 years)   . Alcohol Use: No   OB History   Grav Para Term Preterm Abortions TAB SAB Ect Mult Living                 Review of Systems  Constitutional: Negative for fever, chills,  diaphoresis and unexpected weight change.  Gastrointestinal: Positive for constipation. Negative for nausea, vomiting, abdominal pain, blood in stool, anal bleeding and rectal pain.  All other systems reviewed and are negative.    Allergies  Bee venom; Latex; Methylene blue; Mold extract; Penicillins; and Sulfonamide derivatives  Home Medications   Current Outpatient Rx  Name  Route  Sig  Dispense  Refill  . albuterol (PROVENTIL HFA;VENTOLIN HFA) 108 (90 BASE) MCG/ACT inhaler   Inhalation   Inhale 2 puffs into the lungs every 6 (six) hours as needed for wheezing.         Marland Kitchen aspirin EC 81 MG tablet   Oral   Take 81 mg by mouth daily.         Marland Kitchen azelastine (ASTELIN) 137 MCG/SPRAY nasal spray   Nasal   Place 1 spray into the nose as needed. Use in each nostril as directed          . butalbital-acetaminophen-caffeine (FIORICET WITH CODEINE) 50-325-40-30 MG per capsule   Oral   Take 1 capsule by mouth every 4 (four) hours as needed.           . cetirizine (ZYRTEC) 10 MG tablet   Oral   Take 10 mg  by mouth daily.         Marland Kitchen EPINEPHrine (EPIPEN) 0.3 mg/0.3 mL DEVI   Intramuscular   Inject 0.3 mg into the muscle as needed.           . gabapentin (NEURONTIN) 100 MG capsule   Oral   Take 100-300 mg by mouth 3 (three) times daily. Takes 100mg  twice in the day and 300mg  in the evening         . hydrOXYzine (ATARAX/VISTARIL) 25 MG tablet   Oral   Take 25-50 mg by mouth 3 (three) times daily as needed for itching.         . levalbuterol (XOPENEX HFA) 45 MCG/ACT inhaler   Inhalation   Inhale 1-2 puffs into the lungs every 4 (four) hours as needed.           Marland Kitchen levocetirizine (XYZAL) 5 MG tablet   Oral   Take 5 mg by mouth every evening.         . lisdexamfetamine (VYVANSE) 30 MG capsule   Oral   Take 30 mg by mouth every morning.         Marland Kitchen LORazepam (ATIVAN) 1 MG tablet   Oral   Take 1-2 mg by mouth every 8 (eight) hours as needed for anxiety.           . Melatonin 3 MG TABS   Oral   Take 1 tablet by mouth at bedtime.           . methocarbamol (ROBAXIN) 750 MG tablet   Oral   Take 750 mg by mouth 3 (three) times daily.           . Milnacipran (SAVELLA) 50 MG TABS   Oral   Take 50 mg by mouth 2 (two) times daily.           Marland Kitchen morphine (AVINZA) 90 MG 24 hr capsule   Oral   Take 90 mg by mouth daily.           . Multiple Vitamin (MULTIVITAMIN) tablet   Oral   Take 1 tablet by mouth daily.           Marland Kitchen oxymorphone (OPANA ER) 7.5 MG TB12 12 hr tablet   Oral   Take 7.5 mg by mouth every 12 (twelve) hours.         . predniSONE (DELTASONE) 5 MG tablet   Oral   Take 5 mg by mouth daily. Does course of 4-3-2-1 once monthly         . promethazine (PHENERGAN) 25 MG tablet   Oral   Take 25 mg by mouth every 6 (six) hours as needed.           . SUMAtriptan (IMITREX) 6 MG/0.5ML SOLN injection   Subcutaneous   Inject 6 mg into the skin every 2 (two) hours as needed for migraine or headache. F          BP 149/85  Pulse 100  Temp(Src) 98.5 F (36.9 C) (Oral)  Resp 18  Ht 5\' 6"  (1.676 m)  Wt 145 lb (65.772 kg)  BMI 23.41 kg/m2  SpO2 100% Physical Exam  Nursing note and vitals reviewed. Constitutional: She is oriented to person, place, and time. She appears well-developed and well-nourished. No distress.  HENT:  Head: Normocephalic.  Cardiovascular: Normal rate and regular rhythm.   Pulmonary/Chest: Effort normal and breath sounds normal. No respiratory distress. She has no wheezes. She has no rales.  Abdominal: Soft.  There is no tenderness.  Genitourinary:     Chaperone was present. There is small abrasions to the superior skin above the anus area. No tenderness. No bleeding. There is a small fluctuant and reducible right sided hemorrhoid. No internal masses or tenderness within the rectum. Normal rectal tone.  Neurological: She is alert and oriented to person, place, and time.  Skin: Skin is warm and dry.  No rash noted.  Psychiatric: She has a normal mood and affect. Her behavior is normal.    ED Course   Procedures     1. Hemorrhoid     MDM  11:15 PM patient seen and evaluated. Patient appears well in no acute distress.  Angus Seller, PA-C 04/07/13 [phone]

## 2013-04-08 NOTE — ED Provider Notes (Signed)
Medical screening examination/treatment/procedure(s) were performed by non-physician practitioner and as supervising physician I was immediately available for consultation/collaboration.  Toniyah Dilmore M Aphrodite Harpenau, MD 04/08/13 0754 

## 2015-05-15 ENCOUNTER — Other Ambulatory Visit (HOSPITAL_COMMUNITY): Payer: Self-pay | Admitting: Family Medicine

## 2015-05-15 ENCOUNTER — Other Ambulatory Visit: Payer: Self-pay | Admitting: Family Medicine

## 2015-05-15 DIAGNOSIS — Z1231 Encounter for screening mammogram for malignant neoplasm of breast: Secondary | ICD-10-CM

## 2015-05-15 DIAGNOSIS — R922 Inconclusive mammogram: Secondary | ICD-10-CM

## 2015-05-21 ENCOUNTER — Ambulatory Visit
Admission: RE | Admit: 2015-05-21 | Discharge: 2015-05-21 | Disposition: A | Discharge status: Home / Self Care | Payer: Medicare Other | Source: Ambulatory Visit | Attending: Family Medicine | Admitting: Family Medicine

## 2015-05-21 DIAGNOSIS — R922 Inconclusive mammogram: Secondary | ICD-10-CM

## 2015-10-22 ENCOUNTER — Other Ambulatory Visit: Payer: Self-pay | Admitting: Family Medicine

## 2015-10-22 DIAGNOSIS — R922 Inconclusive mammogram: Secondary | ICD-10-CM

## 2015-10-22 DIAGNOSIS — N6489 Other specified disorders of breast: Secondary | ICD-10-CM

## 2015-10-22 DIAGNOSIS — N6310 Unspecified lump in the right breast, unspecified quadrant: Secondary | ICD-10-CM

## 2015-10-31 ENCOUNTER — Other Ambulatory Visit: Payer: Self-pay

## 2015-10-31 ENCOUNTER — Ambulatory Visit
Admission: RE | Admit: 2015-10-31 | Discharge: 2015-10-31 | Disposition: A | Discharge status: Home / Self Care | Payer: Medicare Other | Source: Ambulatory Visit | Attending: Family Medicine | Admitting: Family Medicine

## 2015-10-31 DIAGNOSIS — R922 Inconclusive mammogram: Secondary | ICD-10-CM

## 2015-10-31 DIAGNOSIS — N6489 Other specified disorders of breast: Secondary | ICD-10-CM

## 2015-10-31 DIAGNOSIS — Z1231 Encounter for screening mammogram for malignant neoplasm of breast: Secondary | ICD-10-CM

## 2015-10-31 DIAGNOSIS — N6310 Unspecified lump in the right breast, unspecified quadrant: Secondary | ICD-10-CM

## 2015-10-31 MED ORDER — GADOBENATE DIMEGLUMINE 529 MG/ML IV SOLN
14.0000 mL | Freq: Once | INTRAVENOUS | Status: AC | PRN
  Administered 2015-10-31: 14 mL via INTRAVENOUS

## 2015-11-06 ENCOUNTER — Ambulatory Visit
Admission: RE | Admit: 2015-11-06 | Discharge: 2015-11-06 | Disposition: A | Discharge status: Home / Self Care | Payer: Medicare Other | Source: Ambulatory Visit | Attending: Family Medicine | Admitting: Family Medicine

## 2015-11-06 ENCOUNTER — Other Ambulatory Visit: Payer: Self-pay | Admitting: Family Medicine

## 2015-11-06 DIAGNOSIS — N6310 Unspecified lump in the right breast, unspecified quadrant: Secondary | ICD-10-CM

## 2015-11-06 DIAGNOSIS — Z9882 Breast implant status: Secondary | ICD-10-CM

## 2015-11-15 ENCOUNTER — Ambulatory Visit
Admission: RE | Admit: 2015-11-15 | Discharge: 2015-11-15 | Disposition: A | Discharge status: Home / Self Care | Payer: Medicare Other | Source: Ambulatory Visit | Attending: Family Medicine | Admitting: Family Medicine

## 2015-11-15 ENCOUNTER — Other Ambulatory Visit: Payer: Self-pay | Admitting: Family Medicine

## 2015-11-15 DIAGNOSIS — N6310 Unspecified lump in the right breast, unspecified quadrant: Secondary | ICD-10-CM

## 2015-11-15 DIAGNOSIS — Z9882 Breast implant status: Secondary | ICD-10-CM

## 2015-12-11 ENCOUNTER — Other Ambulatory Visit: Payer: Self-pay | Admitting: Family Medicine

## 2015-12-11 DIAGNOSIS — N6310 Unspecified lump in the right breast, unspecified quadrant: Secondary | ICD-10-CM

## 2015-12-18 ENCOUNTER — Ambulatory Visit
Admission: RE | Admit: 2015-12-18 | Discharge: 2015-12-18 | Disposition: A | Discharge status: Home / Self Care | Payer: Medicare Other | Source: Ambulatory Visit | Attending: Family Medicine | Admitting: Family Medicine

## 2015-12-18 ENCOUNTER — Other Ambulatory Visit: Payer: Self-pay | Admitting: Family Medicine

## 2015-12-18 DIAGNOSIS — N6310 Unspecified lump in the right breast, unspecified quadrant: Secondary | ICD-10-CM

## 2015-12-26 ENCOUNTER — Other Ambulatory Visit: Payer: Self-pay | Admitting: Family Medicine

## 2015-12-26 ENCOUNTER — Ambulatory Visit
Admission: RE | Admit: 2015-12-26 | Discharge: 2015-12-26 | Disposition: A | Discharge status: Home / Self Care | Payer: Medicare Other | Source: Ambulatory Visit | Attending: Family Medicine | Admitting: Family Medicine

## 2015-12-26 DIAGNOSIS — N6310 Unspecified lump in the right breast, unspecified quadrant: Secondary | ICD-10-CM

## 2018-02-07 IMAGING — US US BREAST LTD UNI RIGHT INC AXILLA
1 series · 12 of 16 positions shown · non-contrast
Comparison: Previous exam(s).

CLINICAL DATA: 44-year-old female presenting for second-look
ultrasound of a possible right breast lymph node, and for an
ultrasound correlate of non mass enhancement in the upper inner
right breast identified on recent MRI. The patient states that she
has a persistent palpable area in the superior right breast for
which she was evaluated in [REDACTED] and Saturday April, 2015 at with
ultimately negative diagnostic workups. The patient states that she
has a palpable area in the upper inner quadrant of the right breast
which has been present and a getting larger for 3 months. She also
feels that her entire right breast has decreased in size. She also
describes intermittent tenderness and fullness in the right axilla.

EXAM:
ULTRASOUND OF THE RIGHT BREAST

[Series 1: us breast ltd uni right inc axilla · 0.04mm/px · 12 of 16 slices shown]
[im 1/16]
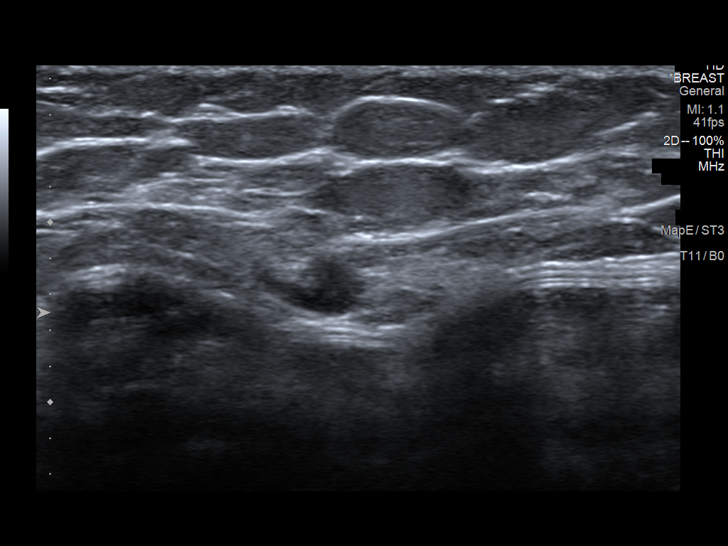
[im 3/16]
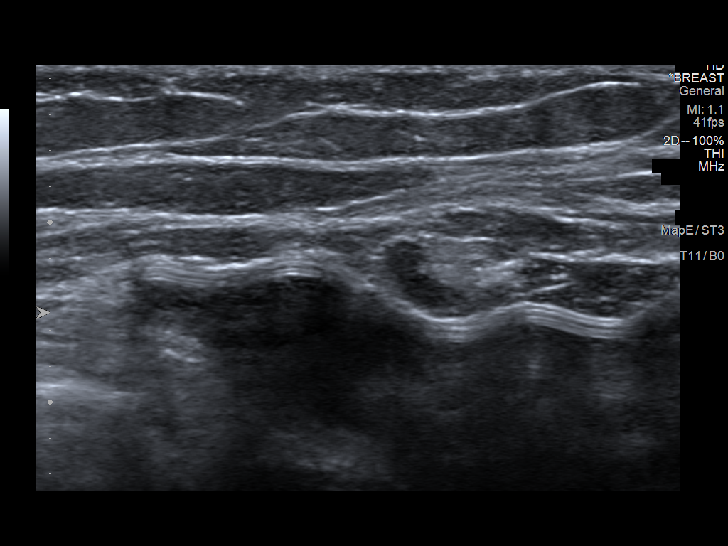
[im 4/16]
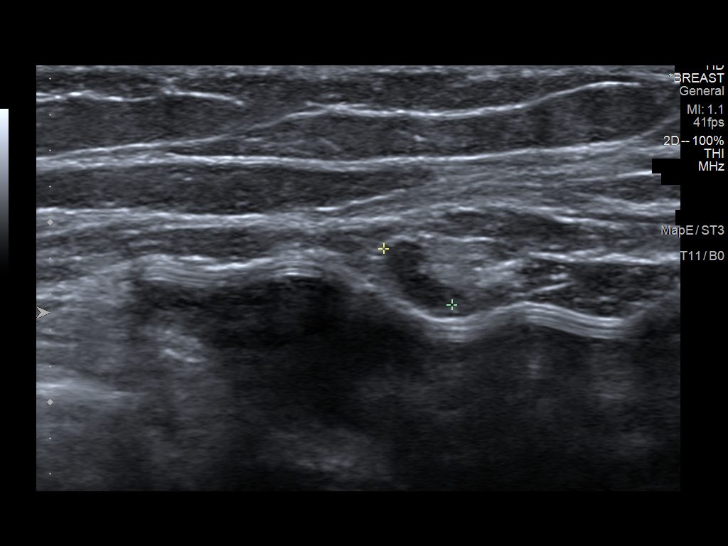
[im 5/16]
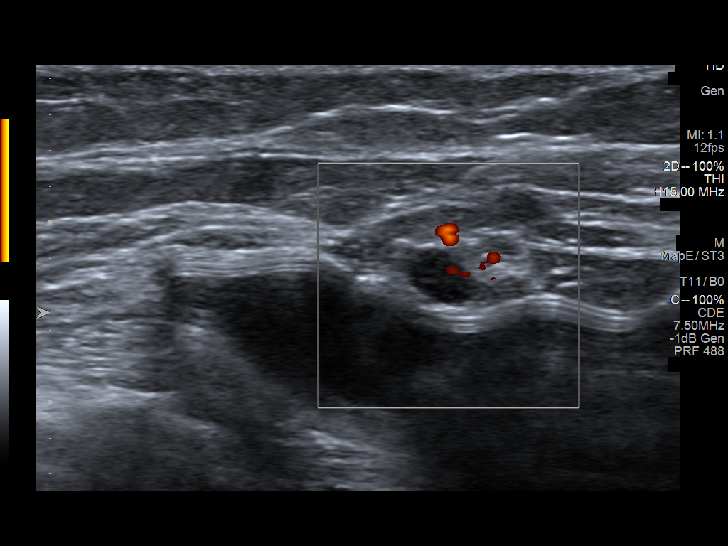
[im 7/16]
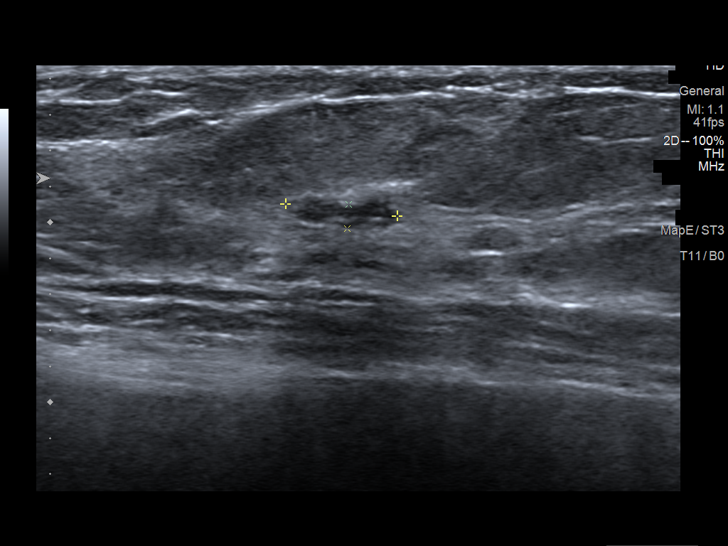
[im 8/16]
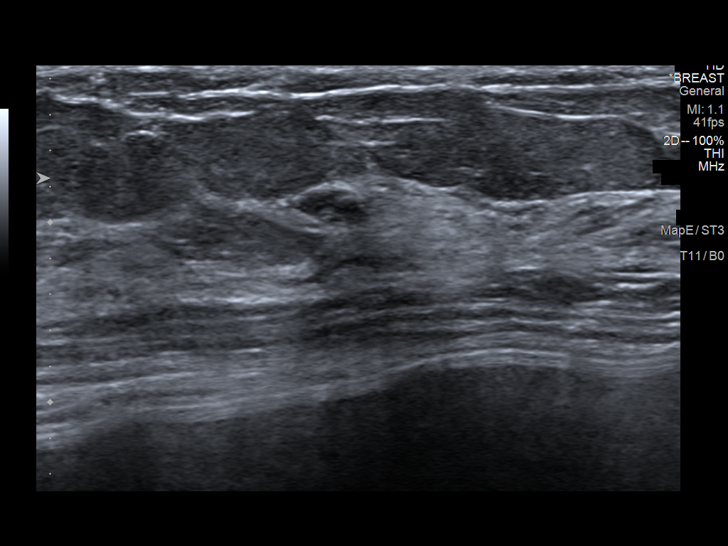
[im 9/16]
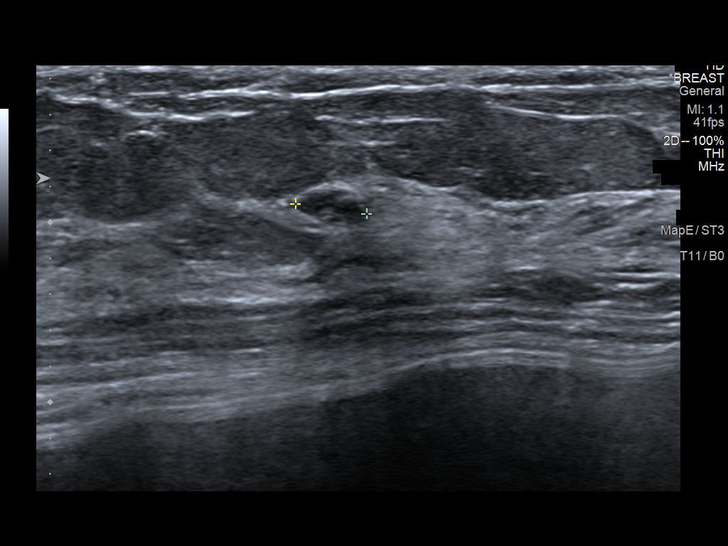
[im 11/16]
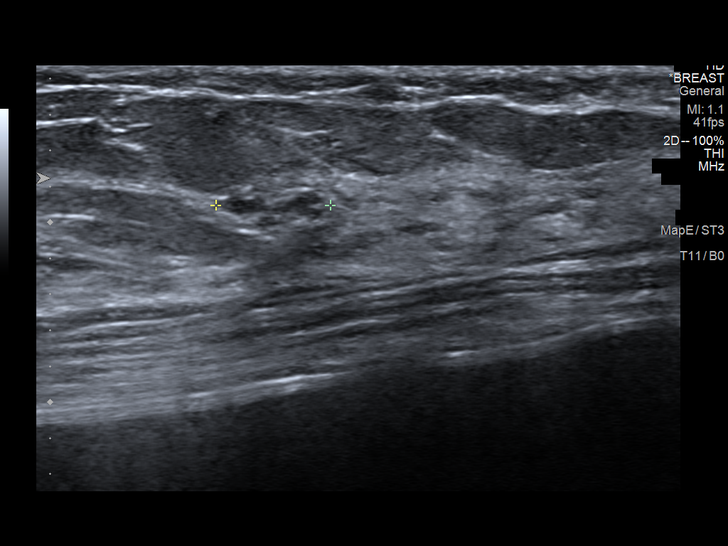
[im 12/16]
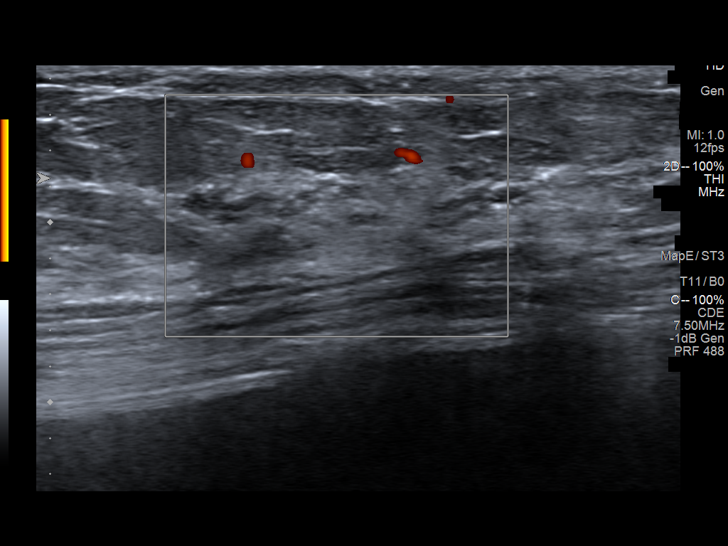
[im 13/16]
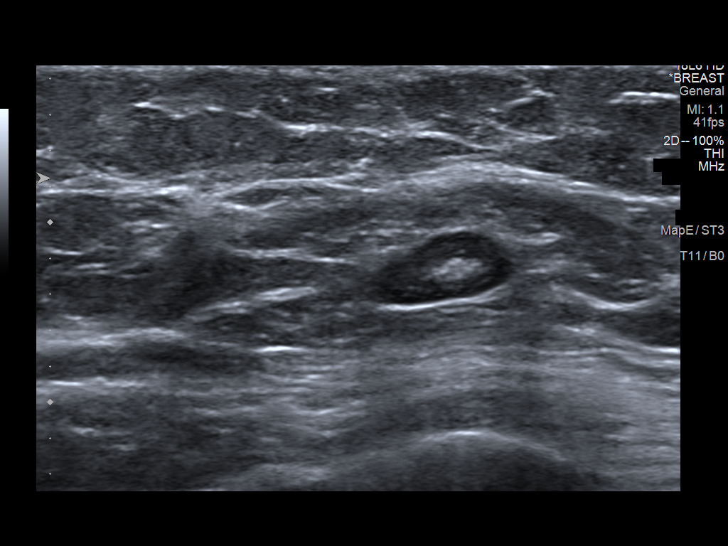
[im 15/16]
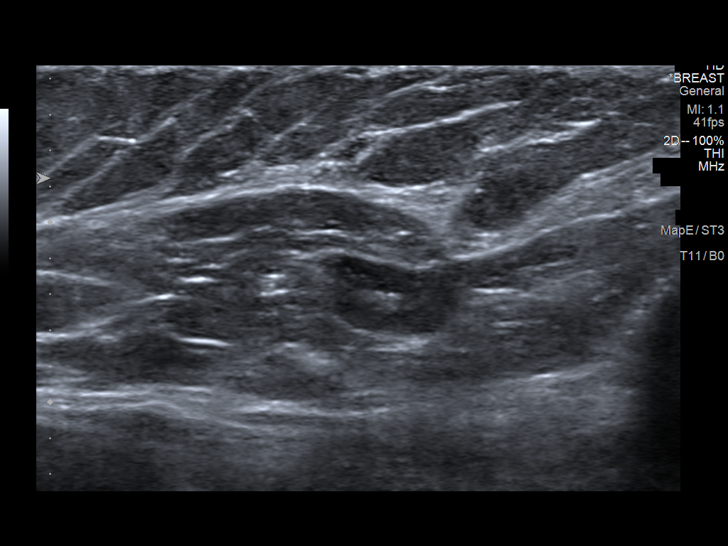
[im 16/16]
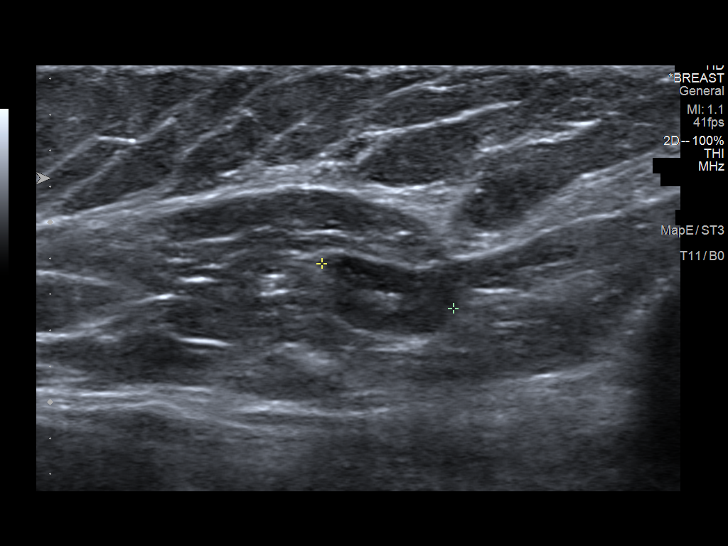

[12 of 16 positions shown; findings below may reference images not displayed]

FINDINGS: On physical exam, no discrete palpable lump is identified in the
upper inner quadrant of the right breast. No palpable lump is
identified in the lateral aspect of the right breast in the area of
the small mass seen on MRI suspected to represent a lymph node.

Targeted ultrasound is performed, showing at 830, 8 cm from the
nipple, there is a small oval circumscribed mass measuring 6 x 3 x 5
mm. There is an echogenic fatty hilum with hilar blood flow
consistent with a benign lymph node. This corresponds in size and
location with the MRI mass in question.

Ultrasound of the upper inner quadrant of the right breast
demonstrates a hypoechoic oval mass at 2 o'clock, 3 cm from the
nipple measuring 6 x 6 x 1 mm. There is some mild hypoechoic areas
immediately adjacent to this mass which altogether measures
approximately 2 cm. The sonographic appearance is suggestive of
fibrocystic change. This corresponds in location with the non mass
enhancement identified by MRI, and could explain the MRI findings.

Ultrasound of the right axilla was performed demonstrating multiple
normal-appearing lymph nodes.
IMPRESSION: 1. The mass identified in the lateral right breast on MRI does
correspond with a normal-appearing lymph node.

2. There is an area in the upper inner quadrant of the right breast
corresponding with the area of the patient identifies as the
palpable area of concern, also corresponding with the non mass
enhancement on MRI. The sonographic appearance is suggestive of
fibrocystic change, however ultrasound-guided biopsy is recommended.

RECOMMENDATION:
1. Ultrasound-guided biopsy is recommended for the right breast, and
has been scheduled for 11/15/2015 at 1 p.m..

2. If the pathology results from the above biopsy is benign, I
recommended that the patient consult her plastic surgeon for
evaluation for possible encapsulation of the right implant given the
description of the "shrinking breast", given the absence of any
other imaging findings.

I have discussed the findings and recommendations with the patient.
Results were also provided in writing at the conclusion of the
visit. If applicable, a reminder letter will be sent to the patient
regarding the next appointment.

BI-RADS CATEGORY  4: Suspicious abnormality - biopsy should be
considered.

## 2019-02-01 ENCOUNTER — Other Ambulatory Visit: Payer: Self-pay | Admitting: Oncology

## 2019-02-01 DIAGNOSIS — N6320 Unspecified lump in the left breast, unspecified quadrant: Secondary | ICD-10-CM

## 2019-02-01 DIAGNOSIS — Z853 Personal history of malignant neoplasm of breast: Secondary | ICD-10-CM

## 2019-02-08 ENCOUNTER — Encounter (HOSPITAL_COMMUNITY): Payer: Self-pay | Admitting: Internal Medicine

## 2019-02-08 ENCOUNTER — Inpatient Hospital Stay (HOSPITAL_COMMUNITY)
Admission: EM | Admit: 2019-02-08 | Discharge: 2019-02-10 | DRG: 917 | Disposition: A | Discharge status: Home / Self Care | Payer: Medicare Other | Attending: Internal Medicine | Admitting: Internal Medicine

## 2019-02-08 ENCOUNTER — Observation Stay (HOSPITAL_COMMUNITY): Payer: Medicare Other

## 2019-02-08 DIAGNOSIS — T40601A Poisoning by unspecified narcotics, accidental (unintentional), initial encounter: Principal | ICD-10-CM | POA: Diagnosis present

## 2019-02-08 DIAGNOSIS — J45909 Unspecified asthma, uncomplicated: Secondary | ICD-10-CM | POA: Diagnosis present

## 2019-02-08 DIAGNOSIS — R402132 Coma scale, eyes open, to sound, at arrival to emergency department: Secondary | ICD-10-CM | POA: Diagnosis present

## 2019-02-08 DIAGNOSIS — R4182 Altered mental status, unspecified: Secondary | ICD-10-CM | POA: Diagnosis not present

## 2019-02-08 DIAGNOSIS — M797 Fibromyalgia: Secondary | ICD-10-CM | POA: Diagnosis present

## 2019-02-08 DIAGNOSIS — R4189 Other symptoms and signs involving cognitive functions and awareness: Secondary | ICD-10-CM | POA: Diagnosis present

## 2019-02-08 DIAGNOSIS — Z20828 Contact with and (suspected) exposure to other viral communicable diseases: Secondary | ICD-10-CM | POA: Diagnosis present

## 2019-02-08 DIAGNOSIS — I10 Essential (primary) hypertension: Secondary | ICD-10-CM | POA: Diagnosis present

## 2019-02-08 DIAGNOSIS — Z888 Allergy status to other drugs, medicaments and biological substances status: Secondary | ICD-10-CM

## 2019-02-08 DIAGNOSIS — Z87891 Personal history of nicotine dependence: Secondary | ICD-10-CM

## 2019-02-08 DIAGNOSIS — K3184 Gastroparesis: Secondary | ICD-10-CM | POA: Diagnosis present

## 2019-02-08 DIAGNOSIS — Z880 Allergy status to penicillin: Secondary | ICD-10-CM

## 2019-02-08 DIAGNOSIS — F419 Anxiety disorder, unspecified: Secondary | ICD-10-CM | POA: Diagnosis present

## 2019-02-08 DIAGNOSIS — R402242 Coma scale, best verbal response, confused conversation, at arrival to emergency department: Secondary | ICD-10-CM | POA: Diagnosis present

## 2019-02-08 DIAGNOSIS — Z7982 Long term (current) use of aspirin: Secondary | ICD-10-CM

## 2019-02-08 DIAGNOSIS — Z8673 Personal history of transient ischemic attack (TIA), and cerebral infarction without residual deficits: Secondary | ICD-10-CM

## 2019-02-08 DIAGNOSIS — R402352 Coma scale, best motor response, localizes pain, at arrival to emergency department: Secondary | ICD-10-CM | POA: Diagnosis present

## 2019-02-08 DIAGNOSIS — G9340 Encephalopathy, unspecified: Secondary | ICD-10-CM | POA: Diagnosis present

## 2019-02-08 DIAGNOSIS — G43909 Migraine, unspecified, not intractable, without status migrainosus: Secondary | ICD-10-CM | POA: Diagnosis present

## 2019-02-08 DIAGNOSIS — E785 Hyperlipidemia, unspecified: Secondary | ICD-10-CM | POA: Diagnosis present

## 2019-02-08 DIAGNOSIS — Z91018 Allergy to other foods: Secondary | ICD-10-CM

## 2019-02-08 DIAGNOSIS — G92 Toxic encephalopathy: Secondary | ICD-10-CM | POA: Diagnosis present

## 2019-02-08 DIAGNOSIS — Z833 Family history of diabetes mellitus: Secondary | ICD-10-CM

## 2019-02-08 DIAGNOSIS — F909 Attention-deficit hyperactivity disorder, unspecified type: Secondary | ICD-10-CM | POA: Diagnosis present

## 2019-02-08 DIAGNOSIS — Z882 Allergy status to sulfonamides status: Secondary | ICD-10-CM

## 2019-02-08 DIAGNOSIS — Z91030 Bee allergy status: Secondary | ICD-10-CM

## 2019-02-08 DIAGNOSIS — G8929 Other chronic pain: Secondary | ICD-10-CM | POA: Diagnosis present

## 2019-02-08 DIAGNOSIS — Z91040 Latex allergy status: Secondary | ICD-10-CM

## 2019-02-08 LAB — COMPREHENSIVE METABOLIC PANEL
ALT: 22 U/L (ref 0–44)
AST: 27 U/L (ref 15–41)
Albumin: 4.1 g/dL (ref 3.5–5.0)
Alkaline Phosphatase: 78 U/L (ref 38–126)
Anion gap: 14 (ref 5–15)
BUN: 8 mg/dL (ref 6–20)
CO2: 22 mmol/L (ref 22–32)
Calcium: 9.4 mg/dL (ref 8.9–10.3)
Chloride: 99 mmol/L (ref 98–111)
Creatinine, Ser: 0.93 mg/dL (ref 0.44–1.00)
GFR calc Af Amer: >60 mL/min (ref >60)
GFR calc non Af Amer: >60 mL/min (ref >60)
Glucose, Bld: 135 mg/dL — ABNORMAL HIGH (ref 70–99)
Potassium: 4.1 mmol/L (ref 3.5–5.1)
Sodium: 135 mmol/L (ref 135–145)
Total Bilirubin: 0.5 mg/dL (ref 0.3–1.2)
Total Protein: 8 g/dL (ref 6.5–8.1)

## 2019-02-08 LAB — POCT I-STAT 7, (LYTES, BLD GAS, ICA,H+H)
Acid-Base Excess: 1 mmol/L (ref 0.0–2.0)
Bicarbonate: 25.8 mmol/L (ref 20.0–28.0)
Calcium, Ion: 1.26 mmol/L (ref 1.15–1.40)
HCT: 37 % (ref 36.0–46.0)
Hemoglobin: 12.6 g/dL (ref 12.0–15.0)
O2 Saturation: 96 %
Patient temperature: 98.6
Potassium: 5.1 mmol/L (ref 3.5–5.1)
Sodium: 137 mmol/L (ref 135–145)
TCO2: 27 mmol/L (ref 22–32)
pCO2 arterial: 42.2 mmHg (ref 32.0–48.0)
pH, Arterial: 7.395 (ref 7.350–7.450)
pO2, Arterial: 82 mmHg — ABNORMAL LOW (ref 83.0–108.0)

## 2019-02-08 LAB — CBC WITH DIFFERENTIAL/PLATELET
Abs Immature Granulocytes: 0.02 10*3/uL (ref 0.00–0.07)
Basophils Absolute: 0.1 10*3/uL (ref 0.0–0.1)
Basophils Relative: 1 %
Eosinophils Absolute: 0.3 10*3/uL (ref 0.0–0.5)
Eosinophils Relative: 3 %
HCT: 39.5 % (ref 36.0–46.0)
Hemoglobin: 12.9 g/dL (ref 12.0–15.0)
Immature Granulocytes: 0 %
Lymphocytes Relative: 42 %
Lymphs Abs: 4.2 10*3/uL — ABNORMAL HIGH (ref 0.7–4.0)
MCH: 26 pg (ref 26.0–34.0)
MCHC: 32.7 g/dL (ref 30.0–36.0)
MCV: 79.6 fL — ABNORMAL LOW (ref 80.0–100.0)
Monocytes Absolute: 0.5 10*3/uL (ref 0.1–1.0)
Monocytes Relative: 5 %
Neutro Abs: 4.9 10*3/uL (ref 1.7–7.7)
Neutrophils Relative %: 49 %
Platelets: 282 10*3/uL (ref 150–400)
RBC: 4.96 MIL/uL (ref 3.87–5.11)
RDW: 11.8 % (ref 11.5–15.5)
WBC: 10 10*3/uL (ref 4.0–10.5)
nRBC: 0 % (ref 0.0–0.2)

## 2019-02-08 LAB — PREGNANCY, URINE: Preg Test, Ur: NEGATIVE

## 2019-02-08 LAB — ETHANOL: Alcohol, Ethyl (B): <10 mg/dL (ref <10)

## 2019-02-08 LAB — RAPID URINE DRUG SCREEN, HOSP PERFORMED
Amphetamines: POSITIVE — AB
Barbiturates: POSITIVE — AB
Benzodiazepines: POSITIVE — AB
Cocaine: NOT DETECTED
Opiates: POSITIVE — AB
Tetrahydrocannabinol: POSITIVE — AB

## 2019-02-08 LAB — CBG MONITORING, ED: Glucose-Capillary: 138 mg/dL — ABNORMAL HIGH (ref 70–99)

## 2019-02-08 MED ORDER — ONDANSETRON HCL 4 MG/2ML IJ SOLN
4.0000 mg | Freq: Once | INTRAMUSCULAR | Status: AC
  Administered 2019-02-08: 19:00:00 4 mg via INTRAVENOUS

## 2019-02-08 MED ORDER — LORAZEPAM 2 MG/ML IJ SOLN
1.0000 mg | Freq: Once | INTRAMUSCULAR | Status: AC
  Administered 2019-02-08: 20:00:00 1 mg via INTRAVENOUS

## 2019-02-08 MED ORDER — NALOXONE HCL 2 MG/2ML IJ SOSY
PREFILLED_SYRINGE | INTRAMUSCULAR | Status: AC
  Administered 2019-02-08: 19:00:00 1 mg
  Filled 2019-02-08: qty 2

## 2019-02-08 MED ORDER — HALOPERIDOL LACTATE 5 MG/ML IJ SOLN
2.0000 mg | Freq: Once | INTRAMUSCULAR | Status: AC
  Administered 2019-02-08: 2 mg via INTRAVENOUS
  Filled 2019-02-08: qty 1

## 2019-02-08 MED ORDER — HALOPERIDOL LACTATE 5 MG/ML IJ SOLN
3.0000 mg | Freq: Once | INTRAMUSCULAR | Status: AC
  Administered 2019-02-08: 20:00:00 3 mg via INTRAVENOUS

## 2019-02-08 MED ORDER — LORAZEPAM 2 MG/ML IJ SOLN
1.0000 mg | Freq: Once | INTRAMUSCULAR | Status: AC
  Administered 2019-02-08: 1 mg via INTRAVENOUS
  Filled 2019-02-08: qty 1

## 2019-02-08 MED ORDER — ONDANSETRON HCL 4 MG/2ML IJ SOLN
INTRAMUSCULAR | Status: AC
  Filled 2019-02-08: qty 2

## 2019-02-08 NOTE — ED Notes (Signed)
ED TO INPATIENT HANDOFF REPORT  ED Nurse Name and Phone #: [phone]  S Name/Age/Gender Nancy Barry 48 y.o. female Room/Bed: 016C/016C  Code Status   Code Status: Not on file  Home/SNF/Other Dc home Confuse responding to pain      Chief Complaint Unresponsive  Triage Note Bib ems from home found on floor unresponsive by boyfriend around 6pm. LSN at 1648. Pt arrives to ED unresponsive, NPA in place. VSS.    Allergies Allergies  Allergen Reactions  . Bee Venom Shortness Of Breath and Swelling  . Mold Extract [Trichophyton] Anaphylaxis and Shortness Of Breath    Induces asthma  . Penicillins Anaphylaxis, Hives and Swelling    Did it involve swelling of the face/tongue/throat, SOB, or low BP? Yes Did it involve sudden or severe rash/hives, skin peeling, or any reaction on the inside of your mouth or nose? Yes Did you need to seek medical attention at a hospital or doctor's office? Yes When did it last happen? Patient was a child If all above answers are "NO", may proceed with cephalosporin use.   . Sulfa Antibiotics Anaphylaxis  . Sulfonamide Derivatives Anaphylaxis  . Yellow Dyes (Non-Tartrazine) Shortness Of Breath and Other (See Comments)    #5- Triggers ADHD, also  . Latex Hives, Swelling and Rash         . Methylene Blue Other (See Comments)    Reaction not recalled  . Silver Other (See Comments)    Blisters ??  . Celery Oil Other (See Comments)    MIGRAINES  . Chantix [Varenicline] Other (See Comments)    Causes night terrors  . Monosodium Glutamate Other (See Comments)    Migraines  . Other Other (See Comments)    Seaweed- Migraines Any meds that "might" cause mental status changes ACTUALLY DO   . Nickel Rash and Other (See Comments)    Blisters   . Tape Swelling, Dermatitis and Rash    PAPER TAPE ONLY!!    Level of Care/Admitting Diagnosis ED Disposition    ED Disposition Condition Comment   Admit  [facility]  [[id number]]  Level of Care: Progressive [102]  I expect the patient will be discharged within 24 hours: No (not a candidate for 5C-Observation unit)  Covid Evaluation: Screening Protocol (No Symptoms)  Diagnosis: Acute encephalopathy [454098][684437]  Admitting Physician: Eduard ClosKAKRAKANDY, ARSHAD N [phone][[id number]]  Attending Physician: Eduard ClosKAKRAKANDY, ARSHAD N Florian.Pax[[id number]]  PT Class (Do Not Modify): Observation [104]  PT Acc Code (Do Not Modify): Observation [10022]       B Medical/Surgery History Past Medical History:  Diagnosis Date  . ADHD (attention deficit hyperactivity disorder)   . Anxiety   . Asthma   . Chest pain   . Chronic pain   . Fibromyalgia   . Gastroparesis   . History of migraines   . IBS (irritable bowel syndrome)   . Kidney mass    Left  . Palpitations   . TIA (transient ischemic attack)    Past Surgical History:  Procedure Laterality Date  . ABDOMINAL HYSTERECTOMY    . TUBAL LIGATION    . VESICOVAGINAL FISTULA CLOSURE W/ TAH       A IV Location/Drains/Wounds Patient Lines/Drains/Airways Status   Active Line/Drains/Airways    None          Intake/Output Last 24 hours No intake or output data in the 24 hours ending 02/08/19 2306  Labs/Imaging Results for orders placed or performed during the hospital encounter of 02/08/19 (  from the past 48 hour(s))  Ethanol     Status: None   Collection Time: 02/08/19  7:11 PM  Result Value Ref Range   Alcohol, Ethyl (B) <10 <10 mg/dL    Comment: (NOTE) Lowest detectable limit for serum alcohol is 10 mg/dL. For medical purposes only. Performed at [facility], [address] [place], [place] [address]   CBC with Differential/Platelet     Status: Abnormal   Collection Time: 02/08/19  7:11 PM  Result Value Ref Range   WBC 10.0 4.0 - 10.5 K/uL   RBC 4.96 3.87 - 5.11 MIL/uL   Hemoglobin 12.9 12.0 - 15.0 g/dL   HCT 60.439.5 54.036.0 - 98.146.0 %   MCV 79.6 (L) 80.0 - 100.0 fL   MCH 26.0 26.0 - 34.0 pg   MCHC 32.7 30.0 - 36.0 g/dL   RDW  19.111.8 47.811.5 - 29.515.5 %   Platelets 282 150 - 400 K/uL   nRBC 0.0 0.0 - 0.2 %   Neutrophils Relative % 49 %   Neutro Abs 4.9 1.7 - 7.7 K/uL   Lymphocytes Relative 42 %   Lymphs Abs 4.2 (H) 0.7 - 4.0 K/uL   Monocytes Relative 5 %   Monocytes Absolute 0.5 0.1 - 1.0 K/uL   Eosinophils Relative 3 %   Eosinophils Absolute 0.3 0.0 - 0.5 K/uL   Basophils Relative 1 %   Basophils Absolute 0.1 0.0 - 0.1 K/uL   Immature Granulocytes 0 %   Abs Immature Granulocytes 0.02 0.00 - 0.07 K/uL    Comment: Performed at [facility], [address] [place], [place] [address]  Comprehensive metabolic panel     Status: Abnormal   Collection Time: 02/08/19  7:11 PM  Result Value Ref Range   Sodium 135 135 - 145 mmol/L   Potassium 4.1 3.5 - 5.1 mmol/L   Chloride 99 98 - 111 mmol/L   CO2 22 22 - 32 mmol/L   Glucose, Bld 135 (H) 70 - 99 mg/dL   BUN 8 6 - 20 mg/dL   Creatinine, Ser 8.650.93 0.44 - 1.00 mg/dL   Calcium 9.4 8.9 - 78.410.3 mg/dL   Total Protein 8.0 6.5 - 8.1 g/dL   Albumin 4.1 3.5 - 5.0 g/dL   AST 27 15 - 41 U/L   ALT 22 0 - 44 U/L   Alkaline Phosphatase 78 38 - 126 U/L   Total Bilirubin 0.5 0.3 - 1.2 mg/dL   GFR calc non Af Amer >60 >60 mL/min   GFR calc Af Amer >60 >60 mL/min   Anion gap 14 5 - 15    Comment: Performed at [facility], [address] [place], [place] [address]  CBG monitoring, ED     Status: Abnormal   Collection Time: 02/08/19  7:51 PM  Result Value Ref Range   Glucose-Capillary 138 (H) 70 - 99 mg/dL   No results found.  Pending Labs Unresulted Labs (From admission, onward)    Start     Ordered   02/08/19 2239  Pregnancy, urine  ONCE - STAT,   STAT     02/08/19 2238   02/08/19 2236  Blood gas, arterial  ONCE - STAT,   R     02/08/19 2235   02/08/19 2124  Novel Coronavirus,NAA,(SEND-OUT TO REF LAB - TAT 24-48 hrs); Hosp Order  (Asymptomatic Patients Labs)  Once,   STAT    Question:  Rule Out  Answer:  Yes   02/08/19  2123   02/08/19 1859  Rapid urine drug  screen (hospital performed)  ONCE - STAT,   STAT     02/08/19 1858          Vitals/Pain Today's Vitals   02/08/19 2045 02/08/19 2100 02/08/19 2215 02/08/19 2228  BP:  102/63 109/70   Pulse: 99 94 72   Resp: 14 (!) 24    Temp:      TempSrc:      SpO2: 93% 96% 96%   PainSc:    Asleep    Isolation Precautions No active isolations  Medications Medications  naloxone (NARCAN) 2 MG/2ML injection (1 mg  Given 02/08/19 1854)  ondansetron (ZOFRAN) injection 4 mg (4 mg Intravenous Given 02/08/19 1909)  haloperidol lactate (HALDOL) injection 2 mg (2 mg Intravenous Given 02/08/19 1940)  LORazepam (ATIVAN) injection 1 mg (1 mg Intravenous Given 02/08/19 1942)  haloperidol lactate (HALDOL) injection 3 mg (3 mg Intravenous Given 02/08/19 1946)  LORazepam (ATIVAN) injection 1 mg (1 mg Intravenous Given 02/08/19 1947)    Mobility complete     Focused Assessme   R Recommendations: See Admitting Provider Note  Report given to:   Additional Notes:

## 2019-02-08 NOTE — ED Triage Notes (Signed)
Bib ems from home found on floor unresponsive by boyfriend around 6pm. LSN at 1648. Pt arrives to ED unresponsive, NPA in place. VSS.

## 2019-02-08 NOTE — ED Provider Notes (Signed)
[facility] EMERGENCY DEPARTMENT Provider Note   CSN: [id number] Arrival date & time: 02/08/19  1850     History   Chief Complaint Chief Complaint  Patient presents with   Unresponsive    HPI Nancy Barry is a 48 y.o. female.     48 year old female presents from home after being found unresponsive.  Patient has a history of chronic pain as well as fibromyalgia and takes multiple opiates.  EMS got there and blood sugar was above 100.  No reported history of trauma.  No further history obtainable due to her current state.     Past Medical History:  Diagnosis Date   ADHD (attention deficit hyperactivity disorder)    Anxiety    Asthma    Chest pain    Chronic pain    Fibromyalgia    Gastroparesis    History of migraines    IBS (irritable bowel syndrome)    Kidney mass    Left   Palpitations    TIA (transient ischemic attack)     Patient Active Problem List   Diagnosis Date Noted   DYSLIPIDEMIA 11/19/2009   TACHYCARDIA 11/19/2009   SHORTNESS OF BREATH 11/19/2009   PRECORDIAL PAIN 11/19/2009   RHINITIS 07/05/2007   ASTHMA 07/05/2007    Past Surgical History:  Procedure Laterality Date   ABDOMINAL HYSTERECTOMY     TUBAL LIGATION     VESICOVAGINAL FISTULA CLOSURE W/ TAH       OB History   No obstetric history on file.      Home Medications    Prior to Admission medications   Medication Sig Start Date End Date Taking? Authorizing Provider  albuterol (PROVENTIL HFA;VENTOLIN HFA) 108 (90 BASE) MCG/ACT inhaler Inhale 2 puffs into the lungs every 6 (six) hours as needed for wheezing.    [provider]  aspirin EC 81 MG tablet Take 81 mg by mouth daily.    [provider]  azelastine (ASTELIN) 137 MCG/SPRAY nasal spray Place 1 spray into the nose as needed. Use in each nostril as directed     [provider]  butalbital-acetaminophen-caffeine (FIORICET WITH CODEINE) 50-325-40-30 MG per  capsule Take 1 capsule by mouth every 4 (four) hours as needed.      [provider]  cetirizine (ZYRTEC) 10 MG tablet Take 10 mg by mouth daily.    [provider]  EPINEPHrine (EPIPEN) 0.3 mg/0.3 mL DEVI Inject 0.3 mg into the muscle as needed.      [provider]  gabapentin (NEURONTIN) 100 MG capsule Take 100-300 mg by mouth 3 (three) times daily. Takes 100mg  twice in the day and 300mg  in the evening    [provider]  hydrOXYzine (ATARAX/VISTARIL) 25 MG tablet Take 25-50 mg by mouth 3 (three) times daily as needed for itching.    [provider]  levalbuterol Penne Lash HFA) 45 MCG/ACT inhaler Inhale 1-2 puffs into the lungs every 4 (four) hours as needed.      [provider]  levocetirizine (XYZAL) 5 MG tablet Take 5 mg by mouth every evening.    [provider]  lisdexamfetamine (VYVANSE) 30 MG capsule Take 30 mg by mouth every morning.    [provider]  LORazepam (ATIVAN) 1 MG tablet Take 1-2 mg by mouth every 8 (eight) hours as needed for anxiety.    [provider]  Melatonin 3 MG TABS Take 1 tablet by mouth at bedtime.      [provider]  methocarbamol (ROBAXIN) 750 MG tablet Take 750 mg by mouth 3 (three) times daily.      [provider]  Milnacipran (SAVELLA) 50 MG TABS Take 50 mg by mouth 2 (two) times daily.      [provider]  morphine (AVINZA) 90 MG 24 hr capsule Take 90 mg by mouth daily.      [provider]  Multiple Vitamin (MULTIVITAMIN) tablet Take 1 tablet by mouth daily.      [provider]  oxymorphone (OPANA ER) 7.5 MG TB12 12 hr tablet Take 7.5 mg by mouth every 12 (twelve) hours.    [provider]  predniSONE (DELTASONE) 5 MG tablet Take 5 mg by mouth daily. Does course of 4-3-2-1 once monthly    [provider]  promethazine (PHENERGAN) 25 MG tablet Take 25 mg by mouth every 6 (six) hours as needed.      [provider]  SUMAtriptan (IMITREX) 6 MG/0.5ML SOLN injection Inject 6 mg into the skin every 2 (two) hours as needed for migraine or headache. F    [provider]    Family History No family history on file.  Social History Social History   Tobacco Use   Smoking status: Former Smoker   Tobacco comment: Smoking cigarettes (5-6 per day off and on 15 years)   Substance Use Topics   Alcohol use: No   Drug use: No     Allergies   Bee venom, Latex, Methylene blue, Mold extract [trichophyton], Penicillins, and Sulfonamide derivatives   Review of Systems Review of Systems  Unable to perform ROS: Mental status change     Physical Exam Updated Vital Signs BP (!) 156/100 (BP Location: Right Arm)    Pulse 75    Temp (!) 97.4 F (36.3 C) (Oral)    Resp (!) 25    SpO2 95%   Physical Exam Vitals signs and nursing note reviewed.  Constitutional:      General: She is not in acute distress.    Appearance: Normal appearance. She is well-developed. She is not toxic-appearing.  HENT:     Head: Normocephalic and atraumatic.  Eyes:     General: Lids are normal.     Conjunctiva/sclera: Conjunctivae normal.     Pupils: Pupils are equal, round, and reactive to light.  Neck:     Musculoskeletal: Normal range of motion and neck supple.     Thyroid: No thyroid mass.     Trachea: No tracheal deviation.  Cardiovascular:     Rate and Rhythm: Normal rate and regular rhythm.     Heart sounds: Normal heart sounds. No murmur. No gallop.   Pulmonary:     Effort: Pulmonary effort is normal. No respiratory distress.     Breath sounds: Normal breath sounds. No stridor. No decreased breath sounds, wheezing, rhonchi or rales.  Abdominal:     General: Bowel sounds are normal. There is no distension.     Palpations: Abdomen is soft.     Tenderness: There is no abdominal tenderness. There is no rebound.  Musculoskeletal: Normal range of motion.        General: No tenderness.  Skin:     General: Skin is warm and dry.     Findings: No abrasion or rash.  Neurological:     Mental Status: She is lethargic and disoriented.     GCS: GCS eye subscore is 3. GCS verbal subscore is 4. GCS motor subscore is 5.  Sensory: Sensation is intact.     Motor: No tremor or abnormal muscle tone.  Psychiatric:        Attention and Perception: She is inattentive.      ED Treatments / Results  Labs (all labs ordered are listed, but only abnormal results are displayed) Labs Reviewed  ETHANOL  RAPID URINE DRUG SCREEN, HOSP PERFORMED  CBC WITH DIFFERENTIAL/PLATELET  COMPREHENSIVE METABOLIC PANEL    EKG EKG Interpretation  Date/Time:  Wednesday February 08 2019 18:58:21 EDT Ventricular Rate:  102 PR Interval:    QRS Duration: 90 QT Interval:  356 QTC Calculation: 464 R Axis:   68 Text Interpretation:  Sinus tachycardia Borderline T abnormalities, inferior leads Confirmed by Lorre NickAllen, Fabien Travelstead ([id number]) on 02/08/2019 8:59:25 PM   Radiology No results found.  Procedures Procedures (including critical care time)  Medications Ordered in ED Medications  naloxone (NARCAN) 2 MG/2ML injection (1 mg  Given 02/08/19 1854)     Initial Impression / Assessment and Plan / ED Course  I have reviewed the triage vital signs and the nursing notes.  Pertinent labs & imaging results that were available during my care of the patient were reviewed by me and considered in my medical decision making (see chart for details).        On arrival here patient was very sleepy but arousable to painful stimulation.  Patient given Narcan 1 mg IV push.  After about 30 to 60 seconds patient became much more responsive.  Became combative and unable to follow commands.  Was medicated with Ativan and Haldol x2.  Was likely going through withdrawal as she takes 90 mg of morphine a day as well as Opana.  Will admit to the hospital for observation  CRITICAL CARE Performed by: Toy BakerAnthony T Abubakar Crispo Total critical care  time: 60 minutes Critical care time was exclusive of separately billable procedures and treating other patients. Critical care was necessary to treat or prevent imminent or life-threatening deterioration. Critical care was time spent personally by me on the following activities: development of treatment plan with patient and/or surrogate as well as nursing, discussions with consultants, evaluation of patient's response to treatment, examination of patient, obtaining history from patient or surrogate, ordering and performing treatments and interventions, ordering and review of laboratory studies, ordering and review of radiographic studies, pulse oximetry and re-evaluation of patient's condition.  Nancy Barry was evaluated in Emergency Department on 02/08/2019 for the symptoms described in the history of present illness. She was evaluated in the context of the global COVID-19 pandemic, which necessitated consideration that the patient might be at risk for infection with the SARS-CoV-2 virus that causes COVID-19. Institutional protocols and algorithms that pertain to the evaluation of patients at risk for COVID-19 are in a state of rapid change based on information released by regulatory bodies including the CDC and federal and state organizations. These policies and algorithms were followed during the patient's care in the ED.    Final Clinical Impressions(s) / ED Diagnoses   Final diagnoses:  None    ED Discharge Orders    None       Lorre NickAllen, Brenin Heidelberger, MD 02/08/19 2059

## 2019-02-08 NOTE — ED Notes (Signed)
CBG result of 138 reported to [place], Therapist, sports.

## 2019-02-08 NOTE — ED Notes (Signed)
Pt agitated, kicking her legs, nonverbal other than grunting.

## 2019-02-08 NOTE — ED Notes (Signed)
Pt removed c-collar, calm and cooperative at present.   Per family, pt has significant pain in legs and feet and becomes most uncomfortable when those are covered up

## 2019-02-08 NOTE — ED Notes (Signed)
Pt given narcan and immediately became alert, combative in bed.

## 2019-02-09 ENCOUNTER — Inpatient Hospital Stay (HOSPITAL_COMMUNITY): Payer: Medicare Other

## 2019-02-09 ENCOUNTER — Observation Stay (HOSPITAL_COMMUNITY): Payer: Medicare Other

## 2019-02-09 ENCOUNTER — Encounter (HOSPITAL_COMMUNITY): Payer: Self-pay | Admitting: Internal Medicine

## 2019-02-09 DIAGNOSIS — Z87891 Personal history of nicotine dependence: Secondary | ICD-10-CM | POA: Diagnosis not present

## 2019-02-09 DIAGNOSIS — F419 Anxiety disorder, unspecified: Secondary | ICD-10-CM | POA: Diagnosis present

## 2019-02-09 DIAGNOSIS — R402132 Coma scale, eyes open, to sound, at arrival to emergency department: Secondary | ICD-10-CM | POA: Diagnosis present

## 2019-02-09 DIAGNOSIS — R4182 Altered mental status, unspecified: Secondary | ICD-10-CM | POA: Diagnosis present

## 2019-02-09 DIAGNOSIS — Z833 Family history of diabetes mellitus: Secondary | ICD-10-CM | POA: Diagnosis not present

## 2019-02-09 DIAGNOSIS — Z7982 Long term (current) use of aspirin: Secondary | ICD-10-CM | POA: Diagnosis not present

## 2019-02-09 DIAGNOSIS — G92 Toxic encephalopathy: Secondary | ICD-10-CM | POA: Diagnosis present

## 2019-02-09 DIAGNOSIS — G9341 Metabolic encephalopathy: Secondary | ICD-10-CM

## 2019-02-09 DIAGNOSIS — R402242 Coma scale, best verbal response, confused conversation, at arrival to emergency department: Secondary | ICD-10-CM | POA: Diagnosis present

## 2019-02-09 DIAGNOSIS — G43909 Migraine, unspecified, not intractable, without status migrainosus: Secondary | ICD-10-CM | POA: Diagnosis present

## 2019-02-09 DIAGNOSIS — M797 Fibromyalgia: Secondary | ICD-10-CM | POA: Diagnosis present

## 2019-02-09 DIAGNOSIS — Z880 Allergy status to penicillin: Secondary | ICD-10-CM | POA: Diagnosis not present

## 2019-02-09 DIAGNOSIS — Z91018 Allergy to other foods: Secondary | ICD-10-CM | POA: Diagnosis not present

## 2019-02-09 DIAGNOSIS — G8929 Other chronic pain: Secondary | ICD-10-CM | POA: Diagnosis present

## 2019-02-09 DIAGNOSIS — J45909 Unspecified asthma, uncomplicated: Secondary | ICD-10-CM | POA: Diagnosis present

## 2019-02-09 DIAGNOSIS — E785 Hyperlipidemia, unspecified: Secondary | ICD-10-CM | POA: Diagnosis present

## 2019-02-09 DIAGNOSIS — R402352 Coma scale, best motor response, localizes pain, at arrival to emergency department: Secondary | ICD-10-CM | POA: Diagnosis present

## 2019-02-09 DIAGNOSIS — F909 Attention-deficit hyperactivity disorder, unspecified type: Secondary | ICD-10-CM | POA: Diagnosis present

## 2019-02-09 DIAGNOSIS — R4189 Other symptoms and signs involving cognitive functions and awareness: Secondary | ICD-10-CM | POA: Diagnosis present

## 2019-02-09 DIAGNOSIS — K3184 Gastroparesis: Secondary | ICD-10-CM | POA: Diagnosis present

## 2019-02-09 DIAGNOSIS — Z91040 Latex allergy status: Secondary | ICD-10-CM | POA: Diagnosis not present

## 2019-02-09 DIAGNOSIS — Z20828 Contact with and (suspected) exposure to other viral communicable diseases: Secondary | ICD-10-CM | POA: Diagnosis present

## 2019-02-09 DIAGNOSIS — Z888 Allergy status to other drugs, medicaments and biological substances status: Secondary | ICD-10-CM | POA: Diagnosis not present

## 2019-02-09 DIAGNOSIS — Z91030 Bee allergy status: Secondary | ICD-10-CM | POA: Diagnosis not present

## 2019-02-09 DIAGNOSIS — Z882 Allergy status to sulfonamides status: Secondary | ICD-10-CM | POA: Diagnosis not present

## 2019-02-09 DIAGNOSIS — Z8673 Personal history of transient ischemic attack (TIA), and cerebral infarction without residual deficits: Secondary | ICD-10-CM | POA: Diagnosis not present

## 2019-02-09 DIAGNOSIS — T40601A Poisoning by unspecified narcotics, accidental (unintentional), initial encounter: Secondary | ICD-10-CM | POA: Diagnosis present

## 2019-02-09 LAB — CBC WITH DIFFERENTIAL/PLATELET
Abs Immature Granulocytes: 0.03 10*3/uL (ref 0.00–0.07)
Basophils Absolute: 0.1 10*3/uL (ref 0.0–0.1)
Basophils Relative: 1 %
Eosinophils Absolute: 0.1 10*3/uL (ref 0.0–0.5)
Eosinophils Relative: 1 %
HCT: 35.3 % — ABNORMAL LOW (ref 36.0–46.0)
Hemoglobin: 11.9 g/dL — ABNORMAL LOW (ref 12.0–15.0)
Immature Granulocytes: 0 %
Lymphocytes Relative: 27 %
Lymphs Abs: 2.5 10*3/uL (ref 0.7–4.0)
MCH: 26.5 pg (ref 26.0–34.0)
MCHC: 33.7 g/dL (ref 30.0–36.0)
MCV: 78.6 fL — ABNORMAL LOW (ref 80.0–100.0)
Monocytes Absolute: 0.4 10*3/uL (ref 0.1–1.0)
Monocytes Relative: 4 %
Neutro Abs: 6.2 10*3/uL (ref 1.7–7.7)
Neutrophils Relative %: 67 %
Platelets: 252 10*3/uL (ref 150–400)
RBC: 4.49 MIL/uL (ref 3.87–5.11)
RDW: 11.7 % (ref 11.5–15.5)
WBC: 9.3 10*3/uL (ref 4.0–10.5)
nRBC: 0 % (ref 0.0–0.2)

## 2019-02-09 LAB — BASIC METABOLIC PANEL
Anion gap: 9 (ref 5–15)
BUN: 7 mg/dL (ref 6–20)
CO2: 25 mmol/L (ref 22–32)
Calcium: 9.2 mg/dL (ref 8.9–10.3)
Chloride: 104 mmol/L (ref 98–111)
Creatinine, Ser: 0.84 mg/dL (ref 0.44–1.00)
GFR calc Af Amer: >60 mL/min (ref >60)
GFR calc non Af Amer: >60 mL/min (ref >60)
Glucose, Bld: 122 mg/dL — ABNORMAL HIGH (ref 70–99)
Potassium: 4.3 mmol/L (ref 3.5–5.1)
Sodium: 138 mmol/L (ref 135–145)

## 2019-02-09 LAB — GLUCOSE, CAPILLARY
Glucose-Capillary: 130 mg/dL — ABNORMAL HIGH (ref 70–99)
Glucose-Capillary: 108 mg/dL — ABNORMAL HIGH (ref 70–99)
Glucose-Capillary: 117 mg/dL — ABNORMAL HIGH (ref 70–99)
Glucose-Capillary: 143 mg/dL — ABNORMAL HIGH (ref 70–99)
Glucose-Capillary: 121 mg/dL — ABNORMAL HIGH (ref 70–99)
Glucose-Capillary: 148 mg/dL — ABNORMAL HIGH (ref 70–99)

## 2019-02-09 LAB — MAGNESIUM: Magnesium: 2.1 mg/dL (ref 1.7–2.4)

## 2019-02-09 LAB — TSH: TSH: 0.259 u[IU]/mL — ABNORMAL LOW (ref 0.350–4.500)

## 2019-02-09 LAB — HEPATIC FUNCTION PANEL
ALT: 20 U/L (ref 0–44)
AST: 24 U/L (ref 15–41)
Albumin: 3.6 g/dL (ref 3.5–5.0)
Alkaline Phosphatase: 68 U/L (ref 38–126)
Bilirubin, Direct: <0.1 mg/dL (ref 0.0–0.2)
Total Bilirubin: 0.5 mg/dL (ref 0.3–1.2)
Total Protein: 7.5 g/dL (ref 6.5–8.1)

## 2019-02-09 LAB — CK: Total CK: 94 U/L (ref 38–234)

## 2019-02-09 LAB — AMMONIA: Ammonia: 27 umol/L (ref 9–35)

## 2019-02-09 LAB — ACETAMINOPHEN LEVEL: Acetaminophen (Tylenol), Serum: <10 ug/mL — ABNORMAL LOW (ref 10–30)

## 2019-02-09 LAB — HIV ANTIBODY (ROUTINE TESTING W REFLEX): HIV Screen 4th Generation wRfx: NONREACTIVE

## 2019-02-09 LAB — TROPONIN I: Troponin I: <0.03 ng/mL (ref <0.03)

## 2019-02-09 MED ORDER — ALPRAZOLAM ER 1 MG PO TB24: 1.0000 mg | ORAL_TABLET | Freq: Two times a day (BID) | ORAL | Status: DC

## 2019-02-09 MED ORDER — ACETAMINOPHEN 325 MG PO TABS: 650.0000 mg | ORAL_TABLET | Freq: Four times a day (QID) | ORAL | Status: DC | PRN

## 2019-02-09 MED ORDER — KETOROLAC TROMETHAMINE 30 MG/ML IJ SOLN
30.0000 mg | Freq: Once | INTRAMUSCULAR | Status: AC
  Administered 2019-02-09: 30 mg via INTRAVENOUS
  Filled 2019-02-09: qty 1

## 2019-02-09 MED ORDER — MILNACIPRAN HCL 50 MG PO TABS
100.0000 mg | ORAL_TABLET | Freq: Two times a day (BID) | ORAL | Status: DC
  Administered 2019-02-09 – 2019-02-10 (×3): 100 mg via ORAL
  Filled 2019-02-09 (×4): qty 2

## 2019-02-09 MED ORDER — MORPHINE SULFATE ER 15 MG PO TBCR
30.0000 mg | EXTENDED_RELEASE_TABLET | Freq: Two times a day (BID) | ORAL | Status: DC
  Administered 2019-02-09: 10:00:00 30 mg via ORAL
  Filled 2019-02-09: qty 2

## 2019-02-09 MED ORDER — SODIUM CHLORIDE 0.9 % IV SOLN
INTRAVENOUS | Status: AC
  Administered 2019-02-09: 01:00:00 via INTRAVENOUS

## 2019-02-09 MED ORDER — ONDANSETRON HCL 4 MG/2ML IJ SOLN: 4.0000 mg | Freq: Four times a day (QID) | INTRAMUSCULAR | Status: DC | PRN

## 2019-02-09 MED ORDER — MORPHINE SULFATE ER 15 MG PO TBCR
60.0000 mg | EXTENDED_RELEASE_TABLET | Freq: Two times a day (BID) | ORAL | Status: DC
  Administered 2019-02-09 – 2019-02-10 (×2): 60 mg via ORAL
  Filled 2019-02-09 (×2): qty 4

## 2019-02-09 MED ORDER — ALBUTEROL SULFATE (2.5 MG/3ML) 0.083% IN NEBU: 2.5000 mg | INHALATION_SOLUTION | RESPIRATORY_TRACT | Status: DC | PRN

## 2019-02-09 MED ORDER — ONDANSETRON HCL 4 MG PO TABS: 4.0000 mg | ORAL_TABLET | Freq: Four times a day (QID) | ORAL | Status: DC | PRN

## 2019-02-09 MED ORDER — ALPRAZOLAM 0.5 MG PO TABS
1.0000 mg | ORAL_TABLET | Freq: Three times a day (TID) | ORAL | Status: DC
  Administered 2019-02-09 – 2019-02-10 (×4): 1 mg via ORAL
  Filled 2019-02-09 (×4): qty 2

## 2019-02-09 MED ORDER — IOHEXOL 350 MG/ML SOLN
75.0000 mL | Freq: Once | INTRAVENOUS | Status: AC | PRN
  Administered 2019-02-09: 18:00:00 75 mL via INTRAVENOUS

## 2019-02-09 MED ORDER — ACETAMINOPHEN 650 MG RE SUPP: 650.0000 mg | Freq: Four times a day (QID) | RECTAL | Status: DC | PRN

## 2019-02-09 MED ORDER — TAPENTADOL HCL 50 MG PO TABS
50.0000 mg | ORAL_TABLET | Freq: Three times a day (TID) | ORAL | Status: DC | PRN
  Administered 2019-02-09: 50 mg via ORAL
  Filled 2019-02-09 (×2): qty 1

## 2019-02-09 MED ORDER — ENOXAPARIN SODIUM 40 MG/0.4ML ~~LOC~~ SOLN
40.0000 mg | Freq: Every day | SUBCUTANEOUS | Status: DC
  Administered 2019-02-09 – 2019-02-10 (×2): 40 mg via SUBCUTANEOUS
  Filled 2019-02-09 (×2): qty 0.4

## 2019-02-09 MED ORDER — MORPHINE SULFATE ER BEADS 30 MG PO CP24: 60.0000 mg | ORAL_CAPSULE | Freq: Two times a day (BID) | ORAL | Status: DC

## 2019-02-09 MED ORDER — CLONIDINE HCL 0.2 MG/24HR TD PTWK
0.2000 mg | MEDICATED_PATCH | TRANSDERMAL | Status: DC
  Administered 2019-02-09: 01:00:00 0.2 mg via TRANSDERMAL
  Filled 2019-02-09: qty 1

## 2019-02-09 MED ORDER — METHOCARBAMOL 750 MG PO TABS
750.0000 mg | ORAL_TABLET | Freq: Four times a day (QID) | ORAL | Status: DC
  Administered 2019-02-09 – 2019-02-10 (×4): 750 mg via ORAL
  Filled 2019-02-09 (×4): qty 1

## 2019-02-09 NOTE — H&P (Signed)
History and Physical    Nancy Barry BJY:[id number] DOB: 08/01/1971 DOA: 02/08/2019  PCP: No primary care provider on file.  Patient coming from: Home.  Chief Complaint: Unresponsive.  History obtained from ER physician and patient's fianc.  HPI: Nancy Barry is a 48 y.o. female with history of chronic pain fibromyalgia ADHD was found unresponsive last evening around 6 PM at her home by patient's family.  Patient apparently states that about an hour ago patient had talked to him on the phone.  She has been having more than usual pain yesterday.  Has not complained of any headache chest pain shortness of breath nausea vomiting or diarrhea prior to the incident.  On arrival at home patient was found to be unresponsive on the floor and EMS was called and patient was brought to the ER.  ED Course: In the ER patient was lethargic and was given 1 dose of Narcan following which patient became agitated and had to be sedated with Haldol and Ativan.  CT head was unremarkable.  ABG did not show any carbon dioxide retention.  Urine drug screen is positive for amphetamine barbiturates benzodiazepine opiates and tetrahydrocannabinol.  EKG shows sinus tachycardia with nonspecific T wave changes.  COVID-19 test is pending.  Patient was afebrile.  At the time of my exam patient has become more alert but still lethargic.  Patient does not recall why she was here.  Follows commands moves all extremities.  Pupils are reacting to light.  Review of Systems: As per HPI, rest all negative.   Past Medical History:  Diagnosis Date   ADHD (attention deficit hyperactivity disorder)    Anxiety    Asthma    Chest pain    Chronic pain    Fibromyalgia    Gastroparesis    History of migraines    IBS (irritable bowel syndrome)    Kidney mass    Left   Palpitations    TIA (transient ischemic attack)     Past Surgical History:  Procedure Laterality Date   ABDOMINAL HYSTERECTOMY     TUBAL LIGATION      VESICOVAGINAL FISTULA CLOSURE W/ TAH       reports that she has quit smoking. She has never used smokeless tobacco. She reports that she does not drink alcohol or use drugs.  Allergies  Allergen Reactions   Bee Venom Shortness Of Breath and Swelling   Mold Extract [Trichophyton] Anaphylaxis and Shortness Of Breath    Induces asthma   Penicillins Anaphylaxis, Hives and Swelling    Did it involve swelling of the face/tongue/throat, SOB, or low BP? Yes Did it involve sudden or severe rash/hives, skin peeling, or any reaction on the inside of your mouth or nose? Yes Did you need to seek medical attention at a hospital or doctor's office? Yes When did it last happen? Patient was a child If all above answers are NO, may proceed with cephalosporin use.    Sulfa Antibiotics Anaphylaxis   Sulfonamide Derivatives Anaphylaxis   Yellow Dyes (Non-Tartrazine) Shortness Of Breath and Other (See Comments)    #5- Triggers ADHD, also   Latex Hives, Swelling and Rash          Methylene Blue Other (See Comments)    Reaction not recalled   Silver Other (See Comments)    Blisters ??   Celery Oil Other (See Comments)    MIGRAINES   Chantix [Varenicline] Other (See Comments)    Causes night terrors   Monosodium  Glutamate Other (See Comments)    Migraines   Other Other (See Comments)    Seaweed- Migraines Any meds that "might" cause mental status changes ACTUALLY DO    Nickel Rash and Other (See Comments)    Blisters    Tape Swelling, Dermatitis and Rash    PAPER TAPE ONLY!!    Family History  Problem Relation Age of Onset   Diabetes Mellitus II Mother     Prior to Admission medications   Medication Sig Start Date End Date Taking? Authorizing Provider  albuterol (PROVENTIL HFA;VENTOLIN HFA) 108 (90 BASE) MCG/ACT inhaler Inhale 2 puffs into the lungs every 6 (six) hours as needed for wheezing or shortness of breath.    Yes [provider]  ALPRAZolam (XANAX  XR) 1 MG 24 hr tablet Take 1 mg by mouth 2 (two) times a day. 8 AM and 8 PM   Yes [provider]  ALPRAZolam (XANAX) 1 MG tablet Take 1 mg by mouth 4 (four) times daily as needed (for breakthrough anxiety).    Yes [provider]  amphetamine-dextroamphetamine (ADDERALL) 15 MG tablet Take 15 mg by mouth 2 (two) times daily. 8 AM and 2 PM   Yes [provider]  aspirin EC 81 MG tablet Take 81 mg by mouth daily. 8 AM   Yes [provider]  butalbital-acetaminophen-caffeine (FIORICET WITH CODEINE) 50-325-40-30 MG per capsule Take 2 capsules by mouth every 8 (eight) hours as needed for migraine.    Yes [provider]  cetirizine (ZYRTEC) 10 MG tablet Take 10 mg by mouth daily. 8 AM   Yes [provider]  Cholecalciferol (VITAMIN D3) 125 MCG (5000 UT) CAPS Take 5,000 Units by mouth daily with breakfast.    Yes [provider]  cloNIDine (CATAPRES) 0.2 MG tablet Take 0.2-0.4 mg by mouth See admin instructions. Take 0.2 mg by mouth at 8 AM and 0.4 mg at 8 PM   Yes [provider]  EPINEPHrine (EPIPEN) 0.3 mg/0.3 mL DEVI Inject 0.3 mg into the muscle once as needed (for an anaphylactic reaction).    Yes [provider]  famotidine (PEPCID) 20 MG tablet Take 20 mg by mouth 2 (two) times daily.   Yes [provider]  fluticasone (FLONASE) 50 MCG/ACT nasal spray Place 1-2 sprays into both nostrils 2 (two) times daily as needed for allergies or rhinitis.   Yes [provider]  gabapentin (NEURONTIN) 300 MG capsule Take 900 mg by mouth 4 (four) times daily. 2 AM, 8 AM, 2 PM, and 8 PM   Yes [provider]  hydrOXYzine (ATARAX/VISTARIL) 25 MG tablet Take 25 mg by mouth every 6 (six) hours.    Yes [provider]  ibuprofen (ADVIL) 200 MG tablet Take 200-400 mg by mouth every 6 (six) hours as needed for headache or mild pain.   Yes [provider]  levalbuterol (XOPENEX HFA) 45 MCG/ACT  inhaler Inhale 1-2 puffs into the lungs every 4 (four) hours as needed for wheezing or shortness of breath.    Yes [provider]  levalbuterol (XOPENEX) 1.25 MG/3ML nebulizer solution Take 1.25 mg by nebulization every 4 (four) hours as needed for wheezing or shortness of breath.   Yes [provider]  levocetirizine (XYZAL) 5 MG tablet Take 5 mg by mouth daily. 8 AM   Yes [provider]  Liniments (BLUE-EMU SUPER STRENGTH) CREA Apply 1 application topically as needed (for bilateral foot pain).   Yes [provider]  methocarbamol (ROBAXIN) 750 MG tablet Take 750 mg by mouth 4 (four) times daily. 2 AM, 8 AM, 2 PM, and 8 PM   Yes [provider]  Milnacipran HCl (SAVELLA) 100 MG TABS tablet Take 100 mg by mouth 2 (two) times daily. 8 AM and 8 PM   Yes [provider]  morphine (AVINZA) 60 MG 24 hr capsule Take 60 mg by mouth 2 (two) times a day. 8 AM and 8 PM   Yes [provider]  NON FORMULARY Place 1 spray into both nostrils See admin instructions. Natural Migraine Relief Magnesium nasal spray: Instill 1 spray into each nostril daily as directed to reduce migraines   Yes [provider]  omeprazole (PRILOSEC) 20 MG capsule Take 20 mg by mouth 2 (two) times daily before a meal.   Yes [provider]  Prenatal Vit w/Fe-Methylfol-FA (PNV PO) Take 1 capsule by mouth daily.   Yes [provider]  promethazine (PHENERGAN) 25 MG tablet Take 25 mg by mouth every 6 (six) hours as needed for nausea or vomiting.    Yes [provider]  sodium chloride (OCEAN) 0.65 % SOLN nasal spray Place 1 spray into both nostrils as needed for congestion.   Yes [provider]  tapentadol (NUCYNTA) 50 MG tablet Take 50 mg by mouth every 8 (eight) hours. 8 AM, 2 PM, and 8 PM   Yes [provider]  Alpha-Lipoic Acid 600 MG CAPS Take 600 mg by mouth daily.    [provider]  Milk Thistle 250 MG CAPS  Take 250 mg by mouth daily.    [provider]    Physical Exam: Vitals:   02/08/19 2045 02/08/19 2100 02/08/19 2215 02/09/19 0000  BP:  102/63 109/70 131/88  Pulse: 99 94 72 71  Resp: 14 (!) 24  13  Temp:    97.6 F (36.4 C)  TempSrc:    Oral  SpO2: 93% 96% 96% 100%  Weight:    71.2 kg      Constitutional: Moderately built and nourished. Vitals:   02/08/19 2045 02/08/19 2100 02/08/19 2215 02/09/19 0000  BP:  102/63 109/70 131/88  Pulse: 99 94 72 71  Resp: 14 (!) 24  13  Temp:    97.6 F (36.4 C)  TempSrc:    Oral  SpO2: 93% 96% 96% 100%  Weight:    71.2 kg   Eyes: Anicteric no pallor. ENMT: No discharge from the ears eyes nose and mouth. Neck: No mass felt.  No neck rigidity. Respiratory: No rhonchi or crepitations. Cardiovascular: S1-S2 heard. Abdomen: Soft nontender bowel sounds present. Musculoskeletal: No edema.  No joint effusion. Skin: No rash. Neurologic: Lethargic arousable follows commands oriented to her name.  Pupils equal and reacting to light. Psychiatric: Lethargic.   Labs on Admission: I have personally reviewed following labs and imaging studies  CBC: Recent Labs  Lab 02/08/19 1911 02/08/19 2313  WBC 10.0  --   NEUTROABS 4.9  --   HGB 12.9 12.6  HCT 39.5 37.0  MCV 79.6*  --   PLT 282  --    Basic Metabolic Panel: Recent Labs  Lab 02/08/19 1911 02/08/19 2313  NA 135 137  K 4.1 5.1  CL 99  --   CO2 22  --   GLUCOSE 135*  --   BUN 8  --   CREATININE 0.93  --   CALCIUM 9.4  --    GFR: CrCl cannot be calculated (Unknown  ideal weight.). Liver Function Tests: Recent Labs  Lab 02/08/19 1911  AST 27  ALT 22  ALKPHOS 78  BILITOT 0.5  PROT 8.0  ALBUMIN 4.1   No results for input(s): LIPASE, AMYLASE in the last 168 hours. No results for input(s): AMMONIA in the last 168 hours. Coagulation Profile: No results for input(s): INR, PROTIME in the last 168 hours. Cardiac Enzymes: No results for input(s): CKTOTAL, CKMB,  CKMBINDEX, TROPONINI in the last 168 hours. BNP (last 3 results) No results for input(s): PROBNP in the last 8760 hours. HbA1C: No results for input(s): HGBA1C in the last 72 hours. CBG: Recent Labs  Lab 02/08/19 1951  GLUCAP 138*   Lipid Profile: No results for input(s): CHOL, HDL, LDLCALC, TRIG, CHOLHDL, LDLDIRECT in the last 72 hours. Thyroid Function Tests: No results for input(s): TSH, T4TOTAL, FREET4, T3FREE, THYROIDAB in the last 72 hours. Anemia Panel: No results for input(s): VITAMINB12, FOLATE, FERRITIN, TIBC, IRON, RETICCTPCT in the last 72 hours. Urine analysis:    Component Value Date/Time   COLORURINE YELLOW 10/01/2009 0130   APPEARANCEUR CLOUDY (A) 10/01/2009 0130   LABSPEC 1.028 10/01/2009 0130   PHURINE 5.5 10/01/2009 0130   GLUCOSEU NEGATIVE 10/01/2009 0130   HGBUR NEGATIVE 10/01/2009 0130   BILIRUBINUR NEGATIVE 10/01/2009 0130   KETONESUR NEGATIVE 10/01/2009 0130   PROTEINUR NEGATIVE 10/01/2009 0130   UROBILINOGEN 0.2 10/01/2009 0130   NITRITE NEGATIVE 10/01/2009 0130   LEUKOCYTESUR  10/01/2009 0130    NEGATIVE MICROSCOPIC NOT DONE ON URINES WITH NEGATIVE PROTEIN, BLOOD, LEUKOCYTES, NITRITE, OR GLUCOSE <1000 mg/dL.   Sepsis Labs: @LABRCNTIP (procalcitonin:4,lacticidven:4) )No results found for this or any previous visit (from the past 240 hour(s)).   Radiological Exams on Admission: Ct Head Wo Contrast  Result Date: 02/08/2019 CLINICAL DATA:  48 y/o  F; found unresponsive. EXAM: CT HEAD WITHOUT CONTRAST TECHNIQUE: Contiguous axial images were obtained from the base of the skull through the vertex without intravenous contrast. COMPARISON:  10/22/2009 MRI and MRA of the head. FINDINGS: Brain: No evidence of acute infarction, hemorrhage, hydrocephalus, extra-axial collection or mass lesion/mass effect. Vascular: No hyperdense vessel or unexpected calcification. Skull: Normal. Negative for fracture or focal lesion. Sinuses/Orbits: No acute finding. Other: None.  IMPRESSION: No acute intracranial abnormality identified. Unremarkable CT of the head. Electronically Signed   By: Kristine Garbe M.D.   On: 02/08/2019 23:22    EKG: Independently reviewed.  Sinus tachycardia.  Nonspecific T wave changes.  Assessment/Plan Principal Problem:   Acute encephalopathy Active Problems:   Chronic pain   Fibromyalgia    1. Unresponsive episode/acute encephalopathy -likely could be from medication.  As per patient's fianc patient did not have any suicidal ideation.  Has had more than usual pain yesterday.  At this time will keep patient n.p.o. hold all medications gently hydrate recheck labs including Tylenol levels ammonia level CK TSH metabolic panel check EEG and closely monitor in telemetry.  Once patient becomes more alert and awake slowly restart her pain medications. 2. Chronic pain and fibromyalgia and ADHD -once patient becomes more alert awake will restart pain medications and Adderall slowly. 3. History of hypertension on clonidine -we will keep patient on clonidine patch until patient can take orally.  Once patient can take orally discontinue clonidine patch.   DVT prophylaxis: Lovenox. Code Status: Full code. Family Communication: Discussed with patient's fianc. Disposition Plan: Home. Consults called: None. Admission status: Observation.   Rise Patience MD [facility] Pager [phone].  If 7PM-7AM, please contact night-coverage  [email or web address] Password [email or web address]  02/09/2019, 12:20 AM

## 2019-02-09 NOTE — Procedures (Signed)
  [place] A. Merlene Laughter, MD     [email or web address]           HISTORY: This is a 48 year old female who presents with unresponsiveness worrisome for seizures as a potential etiology.  MEDICATIONS:  Current Facility-Administered Medications:  .  0.9 %  sodium chloride infusion, , Intravenous, Continuous, Rise Patience, MD, Last Rate: 100 mL/hr at 02/09/19 0400 .  acetaminophen (TYLENOL) tablet 650 mg, 650 mg, Oral, Q6H PRN **OR** acetaminophen (TYLENOL) suppository 650 mg, 650 mg, Rectal, Q6H PRN, Rise Patience, MD .  albuterol (PROVENTIL) (2.5 MG/3ML) 0.083% nebulizer solution 2.5 mg, 2.5 mg, Inhalation, Q4H PRN, Rise Patience, MD .  ALPRAZolam Duanne Moron) tablet 1 mg, 1 mg, Oral, TID, Swayze, Ava, DO, 1 mg at 02/09/19 1016 .  cloNIDine (CATAPRES - Dosed in mg/24 hr) patch 0.2 mg, 0.2 mg, Transdermal, Q Wed, Gean Birchwood N, MD, 0.2 mg at 02/09/19 0059 .  enoxaparin (LOVENOX) injection 40 mg, 40 mg, Subcutaneous, Daily, Rise Patience, MD, 40 mg at 02/09/19 1016 .  morphine (MS CONTIN) 12 hr tablet 60 mg, 60 mg, Oral, Q12H, Swayze, Ava, DO .  ondansetron (ZOFRAN) tablet 4 mg, 4 mg, Oral, Q6H PRN **OR** ondansetron (ZOFRAN) injection 4 mg, 4 mg, Intravenous, Q6H PRN, Rise Patience, MD     ANALYSIS: A 16 channel recording using standard 10 20 measurements is conducted for 25 minutes.   There is a background activity of 7.5-8 hertz which attenuates with eye-opening.  There is beta activity observed frontal areas.  There is normal amount of myogenic artifacts seen throughout the recording.  There are few episodes of movements without electrographic correlates.  Photic stimulation is carried out without abnormal changes in the background activity.  Drowsiness is seen transitioning to spindles and K complexes indicating stage II non-REM sleep.  There few episodes of generalized semi rhythmic intermittent rhythmic delta activity with frontal  central maximum.  No clear evidence of focal or lateralized slowing.  No epileptiform activity is observed.   IMPRESSION: 1.   This recording of the awake and sleep states shows mild global slowing.  There is also evidence generalized rhythmic intermittent delta activity typically seen in toxic metabolic processes.  No epileptiform discharges are observed.      Yuval Nolet A. Merlene Laughter, M.D.  Diplomate, Tax adviser of Psychiatry and Neurology ( Neurology).

## 2019-02-09 NOTE — Progress Notes (Signed)
EEG complete - results pending 

## 2019-02-10 ENCOUNTER — Other Ambulatory Visit: Payer: Self-pay

## 2019-02-10 LAB — GLUCOSE, CAPILLARY
Glucose-Capillary: 109 mg/dL — ABNORMAL HIGH (ref 70–99)
Glucose-Capillary: 113 mg/dL — ABNORMAL HIGH (ref 70–99)
Glucose-Capillary: 115 mg/dL — ABNORMAL HIGH (ref 70–99)

## 2019-02-10 LAB — NOVEL CORONAVIRUS, NAA (HOSP ORDER, SEND-OUT TO REF LAB; TAT 18-24 HRS): SARS-CoV-2, NAA: NOT DETECTED

## 2019-02-10 MED ORDER — METHOCARBAMOL 750 MG PO TABS: 750.0000 mg | ORAL_TABLET | Freq: Three times a day (TID) | ORAL | 0 refills | Status: AC | PRN

## 2019-02-10 MED ORDER — GABAPENTIN 300 MG PO CAPS: 300.0000 mg | ORAL_CAPSULE | Freq: Four times a day (QID) | ORAL | 0 refills | Status: AC

## 2019-02-10 NOTE — Progress Notes (Signed)
Pt d/c home to self care, pt is stable with no new concerns. D/C instructions done with teach back. Pr verbalize understanding. Pt will be transported out of the hospital by family.

## 2019-02-10 NOTE — Discharge Summary (Addendum)
Physician Discharge Summary  Patient ID: Nancy Barry MRN: [id number] DOB/AGE: 30-Jul-1971 48 y.o.  Admit date: 02/08/2019 Discharge date: 02/10/2019  Admission Diagnoses: Unresponsive due to accidental overdose of narcotics.  Discharge Diagnoses:  Principal Problem:   Acute encephalopathy Active Problems:   Chronic pain   Fibromyalgia   Decreased level of consciousness   Discharged Condition: fair  Hospital Course: In the ER patient was lethargic and was given 1 dose of Narcan following which patient became agitated and had to be sedated with Haldol and Ativan. CT head was unremarkable. ABG did not show any carbon dioxide retention. Urine drug screen is positive for amphetamine barbiturates benzodiazepine opiates and tetrahydrocannabinol. EKG shows sinus tachycardia with nonspecific T wave changes. COVID-19 test is pending. Patient was afebrile. At the time of my exam patient has become more alert but still lethargic. Patient does not recall why she was here. Follows commands moves all extremities. Pupils are reacting to light.  The patient was admitted to a medical bed. Her narcotic and sedating medications were held. The morning after admission, the patient was crying and demanding that her pain medications be restarted. She was restarted on her long acting morphine analog and her long acting anxiolytic only. I discussed the patient in detail with her significant other, Sam. Sam told me that the patient had had a TIA in the past. He expressed concern that this could have been a stroke. Out of an excess of caution the patient underwent a CTA head and neck which were negative for CVA. It did reveal focal plaque within the distal left V4 segment with short-segment moderate approximate 50% stenosis. Left vertebral otherwise patent.   Today the patient was feeling much better and wanted to go home without completion of the work up with an echocardiogram. As stroke was very low on my list  of differential diagnoses, and her CT was negative. I am discharging the patient to home in fair condition.  I am discharging her with discontinuation of some of her sedating medications and reduction of others. The patient will be following up with her pain doctor in the near future.  Consults: None  Significant Diagnostic Studies: radiology: CT scan: CTA head and neck.  Treatments: IV hydration  Discharge Exam: Blood pressure (!) 164/96, pulse 84, temperature 98.1 F (36.7 C), temperature source Oral, resp. rate 16, weight 69.9 kg, SpO2 100 %. General appearance: alert, cooperative and no distress Eyes: conjunctivae/corneas clear. PERRL, EOM's intact. Fundi benign. Neck: no adenopathy, no carotid bruit, no JVD, supple, symmetrical, trachea midline and thyroid not enlarged, symmetric, no tenderness/mass/nodules Resp: clear to auscultation bilaterally Cardio: regular rate and rhythm, S1, S2 normal, no murmur, click, rub or gallop GI: soft, non-tender; bowel sounds normal; no masses,  no organomegaly Extremities: extremities normal, atraumatic, no cyanosis or edema Pulses: 2+ and symmetric Skin: Skin color, texture, turgor normal. No rashes or lesions Neurologic: Alert and oriented X 3, normal strength and tone. Normal symmetric reflexes. Normal coordination and gait   Disposition: Home  Discharge Instructions    Call MD for:  persistant dizziness or light-headedness   Complete by: As directed    Call MD for:  persistant nausea and vomiting   Complete by: As directed    Diet - low sodium heart healthy   Complete by: As directed    Discharge instructions   Complete by: As directed    Follow up with pain medicine doctor ASAP.   Driving Restrictions   Complete by: As directed  No driving while taking anxiety medications, vistaril, muscle relaxers, neurontin, or narcotics.   Increase activity slowly   Complete by: As directed      Allergies as of 02/10/2019      Reactions    Bee Venom Shortness Of Breath, Swelling   Mold Extract [trichophyton] Anaphylaxis, Shortness Of Breath   Induces asthma   Penicillins Anaphylaxis, Hives, Swelling   Did it involve swelling of the face/tongue/throat, SOB, or low BP? Yes Did it involve sudden or severe rash/hives, skin peeling, or any reaction on the inside of your mouth or nose? Yes Did you need to seek medical attention at a hospital or doctor's office? Yes When did it last happen? Patient was a child If all above answers are "NO", may proceed with cephalosporin use.   Sulfa Antibiotics Anaphylaxis   Sulfonamide Derivatives Anaphylaxis   Yellow Dyes (non-tartrazine) Shortness Of Breath, Other (See Comments)   #5- Triggers ADHD, also   Latex Hives, Swelling, Rash      Methylene Blue Other (See Comments)   Reaction not recalled   Silver Other (See Comments)   Blisters ??   Celery Oil Other (See Comments)   MIGRAINES   Chantix [varenicline] Other (See Comments)   Causes night terrors   Monosodium Glutamate Other (See Comments)   Migraines   Other Other (See Comments)   Seaweed- Migraines Any meds that "might" cause mental status changes ACTUALLY DO    Nickel Rash, Other (See Comments)   Blisters   Tape Swelling, Dermatitis, Rash   PAPER TAPE ONLY!!      Medication List    STOP taking these medications   hydrOXYzine 25 MG tablet Commonly known as: ATARAX/VISTARIL   Milk Thistle 250 MG Caps   NON FORMULARY   promethazine 25 MG tablet Commonly known as: PHENERGAN     TAKE these medications   albuterol 108 (90 Base) MCG/ACT inhaler Commonly known as: VENTOLIN HFA Inhale 2 puffs into the lungs every 6 (six) hours as needed for wheezing or shortness of breath.   Alpha-Lipoic Acid 600 MG Caps Take 600 mg by mouth daily.   ALPRAZolam 1 MG 24 hr tablet Commonly known as: XANAX XR Take 1 mg by mouth 2 (two) times a day. 8 AM and 8 PM What changed: Another medication with the same name was removed.  Continue taking this medication, and follow the directions you see here.   amphetamine-dextroamphetamine 15 MG tablet Commonly known as: ADDERALL Take 15 mg by mouth 2 (two) times daily. 8 AM and 2 PM   aspirin EC 81 MG tablet Take 81 mg by mouth daily. 8 AM   Blue-Emu Super Strength Crea Apply 1 application topically as needed (for bilateral foot pain).   butalbital-acetaminophen-caffeine 50-325-40-30 MG capsule Commonly known as: FIORICET WITH CODEINE Take 2 capsules by mouth every 8 (eight) hours as needed for migraine.   cetirizine 10 MG tablet Commonly known as: ZYRTEC Take 10 mg by mouth daily. 8 AM   cloNIDine 0.2 MG tablet Commonly known as: CATAPRES Take 0.2-0.4 mg by mouth See admin instructions. Take 0.2 mg by mouth at 8 AM and 0.4 mg at 8 PM   EpiPen 0.3 mg/0.3 mL Devi Generic drug: EPINEPHrine Inject 0.3 mg into the muscle once as needed (for an anaphylactic reaction).   famotidine 20 MG tablet Commonly known as: PEPCID Take 20 mg by mouth 2 (two) times daily.   fluticasone 50 MCG/ACT nasal spray Commonly known as: FLONASE Place 1-2 sprays into  both nostrils 2 (two) times daily as needed for allergies or rhinitis.   gabapentin 300 MG capsule Commonly known as: NEURONTIN Take 1 capsule (300 mg total) by mouth 4 (four) times daily. 2 AM, 8 AM, 2 PM, and 8 PM What changed: how much to take   ibuprofen 200 MG tablet Commonly known as: ADVIL Take 200-400 mg by mouth every 6 (six) hours as needed for headache or mild pain.   levalbuterol 1.25 MG/3ML nebulizer solution Commonly known as: XOPENEX Take 1.25 mg by nebulization every 4 (four) hours as needed for wheezing or shortness of breath.   levocetirizine 5 MG tablet Commonly known as: XYZAL Take 5 mg by mouth daily. 8 AM   methocarbamol 750 MG tablet Commonly known as: ROBAXIN Take 1 tablet (750 mg total) by mouth every 8 (eight) hours as needed for muscle spasms. 2 AM, 8 AM, 2 PM, and 8 PM What  changed:   when to take this  reasons to take this   morphine 60 MG 24 hr capsule Commonly known as: AVINZA Take 60 mg by mouth 2 (two) times a day. 8 AM and 8 PM   Nucynta 50 MG tablet Generic drug: tapentadol Take 50 mg by mouth every 8 (eight) hours. 8 AM, 2 PM, and 8 PM   omeprazole 20 MG capsule Commonly known as: PRILOSEC Take 20 mg by mouth 2 (two) times daily before a meal.   PNV PO Take 1 capsule by mouth daily.   Savella 100 MG Tabs tablet Generic drug: Milnacipran HCl Take 100 mg by mouth 2 (two) times daily. 8 AM and 8 PM   sodium chloride 0.65 % Soln nasal spray Commonly known as: OCEAN Place 1 spray into both nostrils as needed for congestion.   Vitamin D3 125 MCG (5000 UT) Caps Take 5,000 Units by mouth daily with breakfast.   Xopenex HFA 45 MCG/ACT inhaler Generic drug: levalbuterol Inhale 1-2 puffs into the lungs every 4 (four) hours as needed for wheezing or shortness of breath.       The patient was seen and examined by me. I have spent 38 minutes in her evaluation and discharge.  Signed: Joely Losier 02/10/2019, 5:25 PM

## 2019-02-10 NOTE — Plan of Care (Signed)
Pt is progressing toward desired goal for this admission. 

## 2019-02-10 NOTE — Progress Notes (Signed)
PROGRESS NOTE  Nancy Barry ZOX:[id number] DOB: 1970/10/28 DOA: 02/08/2019 PCP: No primary care provider on file.  Brief History   Nancy Barry is a 48 y.o. female with history of chronic pain fibromyalgia ADHD was found unresponsive last evening around 6 PM at her home by patient's family.  Patient apparently states that about an hour ago patient had talked to him on the phone.  She has been having more than usual pain yesterday.  Has not complained of any headache chest pain shortness of breath nausea vomiting or diarrhea prior to the incident.  On arrival at home patient was found to be unresponsive on the floor and EMS was called and patient was brought to the ER.  ED Course: In the ER patient was lethargic and was given 1 dose of Narcan following which patient became agitated and had to be sedated with Haldol and Ativan.  CT head was unremarkable.  ABG did not show any carbon dioxide retention.  Urine drug screen is positive for amphetamine barbiturates benzodiazepine opiates and tetrahydrocannabinol.  EKG shows sinus tachycardia with nonspecific T wave changes.  COVID-19 test is pending.  Patient was afebrile.  At the time of my exam patient has become more alert but still lethargic.  Patient does not recall why she was here.  Follows commands moves all extremities.  Pupils are reacting to light.  Consultants  . None  Procedures  . None  Antibiotics  . None  Interval History/Subjective  The patient is groggy with slurred speech. Complaining of pain and asking for her usual pain medications.  Objective   Vitals:  Vitals:   02/10/19 0345 02/10/19 0745  BP: 125/70 (!) 164/96  Pulse: 92 84  Resp: 19 16  Temp: 98.2 F (36.8 C) 98.1 F (36.7 C)  SpO2: 99% 100%    Exam:  Constitutional:  . The patient is lethargic. Eyes only partially opened. Speech is difficult to understand.  Respiratory:  . No increased work of breathing. . No wheezes, rales, or rhonchi.  . No tactile  fremitus. Cardiovascular:  . Regular rate and rhythm . No murmurs, ectopy, or gallups . No lateral PMI. No thrills Abdomen:  . Abdomen is soft, non-tender, non-distended. . No hernias, masses, or organomegaly . Hypoactive bowel sounds. Musculoskeletal:  . No cyanosis, clubbing, or edema Skin:  . No rashes, lesions, ulcers . palpation of skin: no induration or nodules Neurologic:  . Pt is unable to cooperate with exam. Psychiatric:  . Pt is unable to cooperate with exam   I have personally reviewed the following:   Today's Data  . Vitals, CBC, CMP  Scheduled Meds: . ALPRAZolam  1 mg Oral TID  . cloNIDine  0.2 mg Transdermal Q Wed  . enoxaparin (LOVENOX) injection  40 mg Subcutaneous Daily  . methocarbamol  750 mg Oral QID  . Milnacipran  100 mg Oral BID  . morphine  60 mg Oral Q12H   Continuous Infusions:  Principal Problem:   Acute encephalopathy Active Problems:   Chronic pain   Fibromyalgia   Decreased level of consciousness   LOS: 1 day    A & P  Decreased level of consciousness: Due to unintentional narcotic overdose due to polypharmacy. Pt responded promptly to Narcan. All sedating medications held overnight. The patient is now awake, but groggy. She is complaining of pain and demanding her narcotics. They will be resumed with instructions to with hold meds for lethargy. No as needed anxiolytics or muscle relaxers. Neurontin  dosing is reduced.  Acute encephalopathy: Pt unresponsive upon arrival in ED. Able to answer questions and follow commands after administration of Narcan. Believe that this was due to an unintentional overdose of prescribed narcotics in the presence of other potentiating and sedating medications. I have discussed the patient at length with her boyfriend, Sam. He raises concern for possible stroke due to the patient's history of TIA. I will do a stroke work up out of an excess of caution. Pt has been restarted on a limited dose of her home  medications with instructions for the medications to be held for lethargy. She will not receive her "as needed" anxiolytics or muscle relaxers.  Chronic pain: The patient receives her pain medication from a pain doctor. She will follow up with him soon after discharge per the patient's boyfriend, Sam.  Fibromyalgia: Neurontin dose has been decreased.  I have seen and examined this patient myself. I have spent 45 minutes in her evaluation and care. I have discussed the patient in detail with her significant other, Sam. All questions were answered to the best of my abilities.  DVT prophylaxis: Lovenox Code Status: Full Code Family Communication: Patient has been discussed in detail with her significant other, Sam at her request. Disposition Plan: Home   Jossie Smoot, DO [facility] Direct contact: see [email or web address]  7PM-7AM contact night coverage as above 02/10/2019, 11:43 AM  LOS: 1 day

## 2019-02-13 ENCOUNTER — Other Ambulatory Visit: Payer: Medicare Other

## 2019-04-24 ENCOUNTER — Ambulatory Visit
Admission: RE | Admit: 2019-04-24 | Discharge: 2019-04-24 | Disposition: A | Discharge status: Home / Self Care | Payer: Medicare Other | Source: Ambulatory Visit | Attending: Oncology | Admitting: Oncology

## 2019-04-24 ENCOUNTER — Other Ambulatory Visit: Payer: Self-pay | Admitting: Oncology

## 2019-04-24 ENCOUNTER — Other Ambulatory Visit: Payer: Self-pay

## 2019-04-24 DIAGNOSIS — N6320 Unspecified lump in the left breast, unspecified quadrant: Secondary | ICD-10-CM

## 2019-11-07 ENCOUNTER — Encounter: Payer: Self-pay | Admitting: General Practice

## 2021-10-14 ENCOUNTER — Other Ambulatory Visit: Payer: Self-pay | Admitting: Orthopaedic Surgery

## 2021-10-14 DIAGNOSIS — R200 Anesthesia of skin: Secondary | ICD-10-CM

## 2021-10-14 DIAGNOSIS — M5416 Radiculopathy, lumbar region: Secondary | ICD-10-CM

## 2021-11-03 ENCOUNTER — Other Ambulatory Visit: Payer: Self-pay

## 2021-11-03 ENCOUNTER — Ambulatory Visit
Admission: RE | Admit: 2021-11-03 | Discharge: 2021-11-03 | Disposition: A | Discharge status: Home / Self Care | Payer: Medicare Other | Source: Ambulatory Visit | Attending: Orthopaedic Surgery | Admitting: Orthopaedic Surgery

## 2021-11-03 DIAGNOSIS — R200 Anesthesia of skin: Secondary | ICD-10-CM

## 2021-11-03 DIAGNOSIS — M5416 Radiculopathy, lumbar region: Secondary | ICD-10-CM

## 2021-11-19 ENCOUNTER — Other Ambulatory Visit: Payer: Self-pay | Admitting: Orthopaedic Surgery

## 2021-11-19 DIAGNOSIS — M542 Cervicalgia: Secondary | ICD-10-CM

## 2021-12-15 ENCOUNTER — Telehealth: Payer: Self-pay | Admitting: Hematology

## 2021-12-15 NOTE — Telephone Encounter (Signed)
Scheduled appt per 4/21 referral. Pt is aware of appt date and time. Pt is aware to arrive 15 mins prior to appt time and to bring and updated insurance card. Pt is aware of appt location.   ?

## 2021-12-23 ENCOUNTER — Ambulatory Visit
Admission: RE | Admit: 2021-12-23 | Discharge: 2021-12-23 | Disposition: A | Discharge status: Home / Self Care | Payer: Medicare Other | Source: Ambulatory Visit | Attending: Orthopaedic Surgery | Admitting: Orthopaedic Surgery

## 2021-12-23 DIAGNOSIS — M542 Cervicalgia: Secondary | ICD-10-CM

## 2021-12-30 ENCOUNTER — Inpatient Hospital Stay: Payer: Medicare Other | Attending: Hematology | Admitting: Hematology

## 2021-12-30 ENCOUNTER — Other Ambulatory Visit: Payer: Self-pay

## 2021-12-30 ENCOUNTER — Inpatient Hospital Stay: Payer: Medicare Other

## 2021-12-30 VITALS — BP 125/87 | HR 123 | Temp 97.5°F | Resp 20 | Wt 159.1 lb

## 2021-12-30 DIAGNOSIS — J45909 Unspecified asthma, uncomplicated: Secondary | ICD-10-CM | POA: Diagnosis not present

## 2021-12-30 DIAGNOSIS — R5383 Other fatigue: Secondary | ICD-10-CM | POA: Diagnosis not present

## 2021-12-30 DIAGNOSIS — Z8673 Personal history of transient ischemic attack (TIA), and cerebral infarction without residual deficits: Secondary | ICD-10-CM | POA: Insufficient documentation

## 2021-12-30 DIAGNOSIS — D72820 Lymphocytosis (symptomatic): Secondary | ICD-10-CM

## 2021-12-30 DIAGNOSIS — D649 Anemia, unspecified: Secondary | ICD-10-CM | POA: Insufficient documentation

## 2021-12-30 DIAGNOSIS — Z09 Encounter for follow-up examination after completed treatment for conditions other than malignant neoplasm: Secondary | ICD-10-CM | POA: Diagnosis not present

## 2021-12-30 DIAGNOSIS — K3184 Gastroparesis: Secondary | ICD-10-CM | POA: Diagnosis not present

## 2021-12-30 DIAGNOSIS — G8929 Other chronic pain: Secondary | ICD-10-CM | POA: Insufficient documentation

## 2021-12-30 DIAGNOSIS — M797 Fibromyalgia: Secondary | ICD-10-CM | POA: Diagnosis not present

## 2021-12-30 LAB — CBC WITH DIFFERENTIAL/PLATELET
Abs Immature Granulocytes: 0.02 10*3/uL (ref 0.00–0.07)
Basophils Absolute: 0.1 10*3/uL (ref 0.0–0.1)
Basophils Relative: 1 %
Eosinophils Absolute: 0.3 10*3/uL (ref 0.0–0.5)
Eosinophils Relative: 3 %
HCT: 41.4 % (ref 36.0–46.0)
Hemoglobin: 14.2 g/dL (ref 12.0–15.0)
Immature Granulocytes: 0 %
Lymphocytes Relative: 36 %
Lymphs Abs: 3.7 10*3/uL (ref 0.7–4.0)
MCH: 28.3 pg (ref 26.0–34.0)
MCHC: 34.3 g/dL (ref 30.0–36.0)
MCV: 82.5 fL (ref 80.0–100.0)
Monocytes Absolute: 0.5 10*3/uL (ref 0.1–1.0)
Monocytes Relative: 5 %
Neutro Abs: 5.6 10*3/uL (ref 1.7–7.7)
Neutrophils Relative %: 55 %
Platelets: 349 10*3/uL (ref 150–400)
RBC: 5.02 MIL/uL (ref 3.87–5.11)
RDW: 12.5 % (ref 11.5–15.5)
WBC: 10.2 10*3/uL (ref 4.0–10.5)
nRBC: 0 % (ref 0.0–0.2)

## 2021-12-30 LAB — CMP (CANCER CENTER ONLY)
ALT: 14 U/L (ref 0–44)
AST: 18 U/L (ref 15–41)
Albumin: 4.7 g/dL (ref 3.5–5.0)
Alkaline Phosphatase: 55 U/L (ref 38–126)
Anion gap: 10 (ref 5–15)
BUN: 15 mg/dL (ref 6–20)
CO2: 27 mmol/L (ref 22–32)
Calcium: 9.9 mg/dL (ref 8.9–10.3)
Chloride: 102 mmol/L (ref 98–111)
Creatinine: 0.88 mg/dL (ref 0.44–1.00)
GFR, Estimated: >60 mL/min (ref >60)
Glucose, Bld: 116 mg/dL — ABNORMAL HIGH (ref 70–99)
Potassium: 4 mmol/L (ref 3.5–5.1)
Sodium: 139 mmol/L (ref 135–145)
Total Bilirubin: 0.4 mg/dL (ref 0.3–1.2)
Total Protein: 8.8 g/dL — ABNORMAL HIGH (ref 6.5–8.1)

## 2021-12-30 LAB — LACTATE DEHYDROGENASE: LDH: 159 U/L (ref 98–192)

## 2021-12-30 LAB — T4, FREE: Free T4: 0.97 ng/dL (ref 0.61–1.12)

## 2021-12-30 LAB — VITAMIN B12: Vitamin B-12: 578 pg/mL (ref 180–914)

## 2021-12-30 NOTE — Progress Notes (Addendum)
. ? ? ?HEMATOLOGY/ONCOLOGY CONSULTATION NOTE ? ?Date of Service: 12/30/2021 ? ?Patient Care Team: ?Rebecka ApleyHemberg, Katherine V, NP as PCP - General (Adult Health Nurse Practitioner) ? ?CHIEF COMPLAINTS/PURPOSE OF CONSULTATION:  ?Lymphocytosis and anemia ? ?HISTORY OF PRESENTING ILLNESS:  ? ?Nancy Barry is a wonderful 51 y.o. female who has been referred to us by Dr .Ihor AustinHemberg, Ruby ColaKatherine V, NP ?Lymph or evaluation and management of lymphocytosis, anemia and significant fatigue. ? ?Has a history of ADD, asthma, fibromyalgia, gastroparesis, significant chronic back pain issues and radiculopathy and is on multiple pain medications.  Patient has had chronic fatigue issues related to her fibromyalgia migraines TIA and being on multiple pain medications that are potentially sedating and can cause fatigue. ?She notes increased fatigue in the last 6 months or so. ? ?She has had some lymphocytosis even in the past and has had peripheral blood flow cytometry that did not show any clonal lymphocytosis and the lymphocytosis was thought to be reactive process potentially related to her vaping. ? ?Patient notes no sudden weight loss.  No new lumps or bumps.  No new skin rashes.  No other acute new focal symptoms. ? ? ?MEDICAL HISTORY:  ?Past Medical History:  ?Diagnosis Date  ? ADHD (attention deficit hyperactivity disorder)   ? Anxiety   ? Asthma   ? Chest pain   ? Chronic pain   ? Fibromyalgia   ? Gastroparesis   ? History of migraines   ? IBS (irritable bowel syndrome)   ? Kidney mass   ? Left  ? Palpitations   ? TIA (transient ischemic attack)   ? ? ?SURGICAL HISTORY: ?Past Surgical History:  ?Procedure Laterality Date  ? ABDOMINAL HYSTERECTOMY    ? TUBAL LIGATION    ? VESICOVAGINAL FISTULA CLOSURE W/ TAH    ? ? ?SOCIAL HISTORY: ?Social History  ? ?Socioeconomic History  ? Marital status: Widowed  ?  Spouse name: Not on file  ? Number of children: Not on file  ? Years of education: Not on file  ? Highest education level: Not on file   ?Occupational History  ? Not on file  ?Tobacco Use  ? Smoking status: Former  ? Smokeless tobacco: Never  ? Tobacco comments:  ?  Smoking cigarettes (5-6 per day off and on 15 years)   ?Substance and Sexual Activity  ? Alcohol use: No  ? Drug use: No  ? Sexual activity: Not on file  ?Other Topics Concern  ? Not on file  ?Social History Narrative  ? Lives with her mother.  ? 50 year old daughter.  ? ?Social Determinants of Health  ? ?Financial Resource Strain: Not on file  ?Food Insecurity: Not on file  ?Transportation Needs: Not on file  ?Physical Activity: Not on file  ?Stress: Not on file  ?Social Connections: Not on file  ?Intimate Partner Violence: Not on file  ? ? ?FAMILY HISTORY: ?Family History  ?Problem Relation Age of Onset  ? Diabetes Mellitus II Mother   ? ? ?ALLERGIES:  is allergic to bee venom, mold extract [trichophyton], penicillins, sulfa antibiotics, sulfonamide derivatives, yellow dyes (non-tartrazine), latex, methylene blue, silver, celery oil, chantix [varenicline], monosodium glutamate, other, nickel, and tape. ? ?MEDICATIONS:  ?Current Outpatient Medications  ?Medication Sig Dispense Refill  ? ACETAMINOPHEN-BUTALBITAL 50-325 MG TABS Take 1 tablet by mouth daily.    ? albuterol (PROVENTIL HFA;VENTOLIN HFA) 108 (90 BASE) MCG/ACT inhaler Inhale 2 puffs into the lungs every 6 (six) hours as needed for wheezing or shortness  of breath.     ? Alpha-Lipoic Acid 600 MG CAPS Take 600 mg by mouth daily.    ? ALPRAZolam (XANAX XR) 1 MG 24 hr tablet Take 1 mg by mouth daily.    ? ALPRAZolam (XANAX) 1 MG tablet Take 1 mg by mouth.    ? amphetamine-dextroamphetamine (ADDERALL) 15 MG tablet Take 15 mg by mouth 2 (two) times daily. 8 AM and 2 PM    ? cetirizine (ZYRTEC) 10 MG tablet Take 10 mg by mouth daily. 8 AM    ? Cholecalciferol (VITAMIN D3) 125 MCG (5000 UT) CAPS Take 5,000 Units by mouth daily with breakfast.     ? EPINEPHrine (EPI-PEN) 0.3 mg/0.3 mL DEVI Inject 0.3 mg into the muscle once as needed  (for an anaphylactic reaction).     ? fluticasone (FLONASE) 50 MCG/ACT nasal spray Place 1-2 sprays into both nostrils 2 (two) times daily as needed for allergies or rhinitis.    ? gabapentin (NEURONTIN) 300 MG capsule Take 1 capsule (300 mg total) by mouth 4 (four) times daily. 2 AM, 8 AM, 2 PM, and 8 PM (Patient taking differently: Take 900 mg by mouth 4 (four) times daily. 2 AM, 8 AM, 2 PM, and 8 PM) 120 capsule 0  ? levalbuterol (XOPENEX HFA) 45 MCG/ACT inhaler Inhale 1-2 puffs into the lungs every 4 (four) hours as needed for wheezing or shortness of breath.     ? levalbuterol (XOPENEX) 1.25 MG/3ML nebulizer solution Take 1.25 mg by nebulization every 4 (four) hours as needed for wheezing or shortness of breath.    ? Liniments (BLUE-EMU SUPER STRENGTH) CREA Apply 1 application topically as needed (for bilateral foot pain).    ? Magnesium 500 MG CAPS Take 500 mg by mouth.    ? Milnacipran HCl (SAVELLA) 100 MG TABS tablet Take 100 mg by mouth 2 (two) times daily. 8 AM and 8 PM    ? morphine (AVINZA) 60 MG 24 hr capsule Take 60 mg by mouth 2 (two) times a day. 8 AM and 8 PM    ? Prenatal Vit w/Fe-Methylfol-FA (PNV PO) Take 1 capsule by mouth daily.    ? sodium chloride (OCEAN) 0.65 % SOLN nasal spray Place 1 spray into both nostrils as needed for congestion.    ? tapentadol (NUCYNTA) 50 MG tablet Take 100 mg by mouth every 8 (eight) hours. 8 AM, 2 PM, and 8 PM    ? aspirin EC 81 MG tablet Take 81 mg by mouth daily. 8 AM (Patient not taking: Reported on 12/30/2021)    ? Butalbital-APAP-Caff-Cod 50-300-40-30 MG CAPS Take 2 capsules by mouth every 8 (eight) hours as needed for migraine.     ? cloNIDine (CATAPRES) 0.2 MG tablet Take 0.2-0.4 mg by mouth See admin instructions. Take 0.2 mg by mouth at 8 AM and 0.4 mg at 8 PM (Patient not taking: Reported on 12/30/2021)    ? famotidine (PEPCID) 20 MG tablet Take 20 mg by mouth 2 (two) times daily. (Patient not taking: Reported on 12/30/2021)    ? ibuprofen (ADVIL) 200 MG  tablet Take 200-400 mg by mouth every 6 (six) hours as needed for headache or mild pain. (Patient not taking: Reported on 12/30/2021)    ? Lemborexant (DAYVIGO) 5 MG TABS Take 1 tablet by mouth at bedtime. (Patient not taking: Reported on 12/30/2021)    ? levocetirizine (XYZAL) 5 MG tablet Take 5 mg by mouth daily. 8 AM (Patient not taking: Reported on 12/30/2021)    ?  methocarbamol (ROBAXIN) 750 MG tablet Take 1 tablet (750 mg total) by mouth every 8 (eight) hours as needed for muscle spasms. 2 AM, 8 AM, 2 PM, and 8 PM (Patient not taking: Reported on 12/30/2021) 10 tablet 0  ? omeprazole (PRILOSEC) 20 MG capsule Take 20 mg by mouth 2 (two) times daily before a meal. (Patient not taking: Reported on 12/30/2021)    ? ?No current facility-administered medications for this visit.  ? ? ?REVIEW OF SYSTEMS:   ?10 Point review of Systems was done is negative except as noted above. ? ?PHYSICAL EXAMINATION: ?ECOG PERFORMANCE STATUS: 2 - Symptomatic, <50% confined to bed ? ?. ?Vitals:  ? 12/30/21 1442  ?BP: 125/87  ?Pulse: (!) 123  ?Resp: 20  ?Temp: (!) 97.5 ?F (36.4 ?C)  ?SpO2: 94%  ? ?Filed Weights  ? 12/30/21 1442  ?Weight: 159 lb 1.6 oz (72.2 kg)  ? ?.Body mass index is 25.68 kg/m?. ? ?GENERAL:alert, in no acute distress and comfortable ?SKIN: no acute rashes, no significant lesions ?EYES: conjunctiva are pink and non-injected, sclera anicteric ?OROPHARYNX: MMM, no exudates, no oropharyngeal erythema or ulceration ?NECK: supple, no JVD ?LYMPH:  no palpable lymphadenopathy in the cervical, axillary or inguinal regions ?LUNGS: clear to auscultation b/l with normal respiratory effort ?HEART: regular rate & rhythm ?ABDOMEN:  normoactive bowel sounds , non tender, not distended.  No palpable hepatosplenomegaly. ?Extremity: no pedal edema ?PSYCH: alert & oriented x 3 with fluent speech ?NEURO: no focal motor/sensory deficits ? ?LABORATORY DATA:  ?I have reviewed the data as listed ? ?. ? ?  Latest Ref Rng & Units 02/09/2019  ?  4:36 AM  02/08/2019  ? 11:13 PM 02/08/2019  ?  7:11 PM  ?CBC  ?WBC 4.0 - 10.5 K/uL 9.3    10.0    ?Hemoglobin 12.0 - 15.0 g/dL 32.4   40.1   02.7    ?Hematocrit 36.0 - 46.0 % 35.3   37.0   39.5    ?Platelets 150 - 400 K/uL

## 2021-12-31 LAB — SURGICAL PATHOLOGY

## 2021-12-31 LAB — TSH: TSH: 0.748 u[IU]/mL (ref 0.350–4.500)

## 2021-12-31 LAB — KAPPA/LAMBDA LIGHT CHAINS
Kappa free light chain: 30.9 mg/L — ABNORMAL HIGH (ref 3.3–19.4)
Kappa, lambda light chain ratio: 2.02 — ABNORMAL HIGH (ref 0.26–1.65)
Lambda free light chains: 15.3 mg/L (ref 5.7–26.3)

## 2022-01-01 LAB — FLOW CYTOMETRY

## 2022-01-01 LAB — COPPER, SERUM: Copper: 160 ug/dL — ABNORMAL HIGH (ref 80–158)

## 2022-01-02 LAB — MULTIPLE MYELOMA PANEL, SERUM
Albumin SerPl Elph-Mcnc: 4.2 g/dL (ref 2.9–4.4)
Albumin/Glob SerPl: 1.2 (ref 0.7–1.7)
Alpha 1: 0.3 g/dL (ref 0.0–0.4)
Alpha2 Glob SerPl Elph-Mcnc: 0.8 g/dL (ref 0.4–1.0)
B-Globulin SerPl Elph-Mcnc: 1.3 g/dL (ref 0.7–1.3)
Gamma Glob SerPl Elph-Mcnc: 1.5 g/dL (ref 0.4–1.8)
Globulin, Total: 3.8 g/dL (ref 2.2–3.9)
IgA: 186 mg/dL (ref 87–352)
IgG (Immunoglobin G), Serum: 1448 mg/dL (ref 586–1602)
IgM (Immunoglobulin M), Srm: 109 mg/dL (ref 26–217)
Total Protein ELP: 8 g/dL (ref 6.0–8.5)

## 2022-01-02 LAB — CARNITINE / ACYLCARNITINE PROFILE, BLD
Carnitine, Esterfied/Free: 1.1 Ratio — ABNORMAL HIGH (ref 0.0–0.9)
Carnitine, Free: 22 umol/L (ref 20–55)
Carnitine, Total: 46 umol/L (ref 27–73)

## 2022-01-05 ENCOUNTER — Telehealth: Payer: Self-pay | Admitting: Hematology

## 2022-01-05 NOTE — Telephone Encounter (Signed)
Scheduled follow-up appointment per 5/9 los. Patient is aware. ?

## 2022-01-14 ENCOUNTER — Inpatient Hospital Stay (HOSPITAL_BASED_OUTPATIENT_CLINIC_OR_DEPARTMENT_OTHER): Payer: Medicare Other | Admitting: Hematology

## 2022-01-14 DIAGNOSIS — R5383 Other fatigue: Secondary | ICD-10-CM | POA: Diagnosis not present

## 2022-01-14 DIAGNOSIS — D72820 Lymphocytosis (symptomatic): Secondary | ICD-10-CM | POA: Diagnosis not present

## 2022-01-14 NOTE — Progress Notes (Signed)
Nancy Barry   HEMATOLOGY/ONCOLOGY PHONE VISIT NOTE  Date of Service: 01/14/2022  Patient Care Team: Bridget Hartshorn, NP as PCP - General (Adult Health Nurse Practitioner)  CHIEF COMPLAINTS/PURPOSE OF CONSULTATION:  Lymphocytosis and anemia  HISTORY OF PRESENTING ILLNESS:  Please see previous note for details on initial presentation  INTERVAL HISTORY:  I connected with Westley Chandler on 01/14/2022 at 3:30 PM EST by telephone visit and verified that I am speaking with the correct person using two identifiers.   I discussed the limitations, risks, security and privacy concerns of performing an evaluation and management service by telemedicine and the availability of in-person appointments. I also discussed with the patient that there may be a patient responsible charge related to this service. The patient expressed understanding and agreed to proceed.   Other persons participating in the visit and their role in the encounter: None  Patient's location: Home Provider's location: [place]  Nancy Barry is a wonderful 51 y.o. female who was contacted for evaluation and management of lymphocytosis, anemia and significant fatigue. She reports She is doing well with no new symptoms or concerns.  She reports persistent fatigue and discomfort.   She notes profuse sweating, weakness, and fluid retention in leg.   She expresses that she feels difficulty with driving and being able to work.  We discussed trying a combination Alpha-lipoic acid and carnitine supplement empirically.  No new lumps, bumps, or lesions/rashes. No new or unexpected weight loss. No other new or acute focal symptoms.  Patient's outside records and available labs were discussed in details. CBC unremarkable. CMP unremarkable. LDH at 159. MM panel shows no protein spike and no indication of monoclonal paraproteinemias K/L light chain ratio is 2.02 KFLC is slightly elevated at 30.9. All other labs  WNL. Peripheral blood flow cytometry done 12/30/2021 indicates no clonal lymphoproliferative process.  MEDICAL HISTORY:  Past Medical History:  Diagnosis Date   ADHD (attention deficit hyperactivity disorder)    Anxiety    Asthma    Chest pain    Chronic pain    Fibromyalgia    Gastroparesis    History of migraines    IBS (irritable bowel syndrome)    Kidney mass    Left   Palpitations    TIA (transient ischemic attack)     SURGICAL HISTORY: Past Surgical History:  Procedure Laterality Date   ABDOMINAL HYSTERECTOMY     TUBAL LIGATION     VESICOVAGINAL FISTULA CLOSURE W/ TAH      SOCIAL HISTORY: Social History   Socioeconomic History   Marital status: Widowed    Spouse name: Not on file   Number of children: Not on file   Years of education: Not on file   Highest education level: Not on file  Occupational History   Not on file  Tobacco Use   Smoking status: Former   Smokeless tobacco: Never   Tobacco comments:    Smoking cigarettes (5-6 per day off and on 15 years)   Substance and Sexual Activity   Alcohol use: No   Drug use: No   Sexual activity: Not on file  Other Topics Concern   Not on file  Social History Narrative   Lives with her mother.   82 year old daughter.   Social Determinants of Health   Financial Resource Strain: Not on file  Food Insecurity: Not on file  Transportation Needs: Not on file  Physical Activity: Not on file  Stress: Not on file  Social Connections: Not on file  Intimate Partner Violence: Not on file    FAMILY HISTORY: Family History  Problem Relation Age of Onset   Diabetes Mellitus II Mother     ALLERGIES:  is allergic to bee venom, mold extract [trichophyton], penicillins, sulfa antibiotics, sulfonamide derivatives, yellow dyes (non-tartrazine), latex, methylene blue, silver, celery oil, chantix [varenicline], monosodium glutamate, other, nickel, and tape.  MEDICATIONS:  Current Outpatient Medications  Medication  Sig Dispense Refill   ACETAMINOPHEN-BUTALBITAL 50-325 MG TABS Take 1 tablet by mouth daily.     albuterol (PROVENTIL HFA;VENTOLIN HFA) 108 (90 BASE) MCG/ACT inhaler Inhale 2 puffs into the lungs every 6 (six) hours as needed for wheezing or shortness of breath.      Alpha-Lipoic Acid 600 MG CAPS Take 600 mg by mouth daily.     ALPRAZolam (XANAX XR) 1 MG 24 hr tablet Take 1 mg by mouth daily.     ALPRAZolam (XANAX) 1 MG tablet Take 1 mg by mouth.     amphetamine-dextroamphetamine (ADDERALL) 15 MG tablet Take 15 mg by mouth 2 (two) times daily. 8 AM and 2 PM     aspirin EC 81 MG tablet Take 81 mg by mouth daily. 8 AM (Patient not taking: Reported on 12/30/2021)     Butalbital-APAP-Caff-Cod 50-300-40-30 MG CAPS Take 2 capsules by mouth every 8 (eight) hours as needed for migraine.      cetirizine (ZYRTEC) 10 MG tablet Take 10 mg by mouth daily. 8 AM     Cholecalciferol (VITAMIN D3) 125 MCG (5000 UT) CAPS Take 5,000 Units by mouth daily with breakfast.      cloNIDine (CATAPRES) 0.2 MG tablet Take 0.2-0.4 mg by mouth See admin instructions. Take 0.2 mg by mouth at 8 AM and 0.4 mg at 8 PM (Patient not taking: Reported on 12/30/2021)     EPINEPHrine (EPI-PEN) 0.3 mg/0.3 mL DEVI Inject 0.3 mg into the muscle once as needed (for an anaphylactic reaction).      famotidine (PEPCID) 20 MG tablet Take 20 mg by mouth 2 (two) times daily. (Patient not taking: Reported on 12/30/2021)     fluticasone (FLONASE) 50 MCG/ACT nasal spray Place 1-2 sprays into both nostrils 2 (two) times daily as needed for allergies or rhinitis.     gabapentin (NEURONTIN) 300 MG capsule Take 1 capsule (300 mg total) by mouth 4 (four) times daily. 2 AM, 8 AM, 2 PM, and 8 PM (Patient taking differently: Take 900 mg by mouth 4 (four) times daily. 2 AM, 8 AM, 2 PM, and 8 PM) 120 capsule 0   ibuprofen (ADVIL) 200 MG tablet Take 200-400 mg by mouth every 6 (six) hours as needed for headache or mild pain. (Patient not taking: Reported on 12/30/2021)      Lemborexant (DAYVIGO) 5 MG TABS Take 1 tablet by mouth at bedtime. (Patient not taking: Reported on 12/30/2021)     levalbuterol (XOPENEX HFA) 45 MCG/ACT inhaler Inhale 1-2 puffs into the lungs every 4 (four) hours as needed for wheezing or shortness of breath.      levalbuterol (XOPENEX) 1.25 MG/3ML nebulizer solution Take 1.25 mg by nebulization every 4 (four) hours as needed for wheezing or shortness of breath.     levocetirizine (XYZAL) 5 MG tablet Take 5 mg by mouth daily. 8 AM (Patient not taking: Reported on 12/30/2021)     Liniments (BLUE-EMU SUPER STRENGTH) CREA Apply 1 application topically as needed (for bilateral foot pain).     Magnesium 500 MG CAPS Take  500 mg by mouth.     methocarbamol (ROBAXIN) 750 MG tablet Take 1 tablet (750 mg total) by mouth every 8 (eight) hours as needed for muscle spasms. 2 AM, 8 AM, 2 PM, and 8 PM (Patient not taking: Reported on 12/30/2021) 10 tablet 0   Milnacipran HCl (SAVELLA) 100 MG TABS tablet Take 100 mg by mouth 2 (two) times daily. 8 AM and 8 PM     morphine (AVINZA) 60 MG 24 hr capsule Take 60 mg by mouth 2 (two) times a day. 8 AM and 8 PM     omeprazole (PRILOSEC) 20 MG capsule Take 20 mg by mouth 2 (two) times daily before a meal. (Patient not taking: Reported on 12/30/2021)     Prenatal Vit w/Fe-Methylfol-FA (PNV PO) Take 1 capsule by mouth daily.     sodium chloride (OCEAN) 0.65 % SOLN nasal spray Place 1 spray into both nostrils as needed for congestion.     tapentadol (NUCYNTA) 50 MG tablet Take 100 mg by mouth every 8 (eight) hours. 8 AM, 2 PM, and 8 PM     No current facility-administered medications for this visit.    REVIEW OF SYSTEMS:   10 Point review of Systems was done is negative except as noted above.  PHYSICAL EXAMINATION: Telemedicine appointment  LABORATORY DATA:  I have reviewed the data as listed  .    Latest Ref Rng & Units 12/30/2021    4:02 PM 02/09/2019    4:36 AM 02/08/2019   11:13 PM  CBC  WBC 4.0 - 10.5 K/uL 10.2    9.3     Hemoglobin 12.0 - 15.0 g/dL 14.2   11.9   12.6    Hematocrit 36.0 - 46.0 % 41.4   35.3   37.0    Platelets 150 - 400 K/uL 349   252       .    Latest Ref Rng & Units 12/30/2021    4:02 PM 02/09/2019    4:36 AM 02/08/2019   11:13 PM  CMP  Glucose 70 - 99 mg/dL 116   122     BUN 6 - 20 mg/dL 15   7     Creatinine 0.44 - 1.00 mg/dL 0.88   0.84     Sodium 135 - 145 mmol/L 139   138   137    Potassium 3.5 - 5.1 mmol/L 4.0   4.3   5.1    Chloride 98 - 111 mmol/L 102   104     CO2 22 - 32 mmol/L 27   25     Calcium 8.9 - 10.3 mg/dL 9.9   9.2     Total Protein 6.5 - 8.1 g/dL 8.8   7.5     Total Bilirubin 0.3 - 1.2 mg/dL 0.4   0.5     Alkaline Phos 38 - 126 U/L 55   68     AST 15 - 41 U/L 18   24     ALT 0 - 44 U/L 14   20      LDH at 159. MM panel shows no protein spike and no indication of monoclonal paraproteinemias K/L light chain ratio is 2.02 KFLC is slightly elevated at 30.9. All other labs WNL. Peripheral blood flow cytometry done 12/30/2021 indicates no clonal lymphoproliferative process.  RADIOGRAPHIC STUDIES: I have personally reviewed the radiological images as listed and agreed with the findings in the report.  ASSESSMENT & PLAN:   50 year old with #1  lymphocytosis-borderline #2 Severe fatigue  Plan  -Patient's outside records and available labs were discussed in details. CBC unremarkable. CMP unremarkable. LDH at 159. MM panel shows no protein spike and no indication of monoclonal paraproteinemias K/L light chain ratio is 2.02 and KFLC is slightly elevated at 30.9. All other labs WNL. Labs not indicative for fatigue. Peripheral blood flow cytometry done 12/30/2021 indicates no clonal lymphoproliferative process. -We discussed that her significant new fatigue is likely multifactorial and could certainly be related to the multiple medications she is on for pain control insomnia radiculopathy etc.. -Follow-up with primary care physician for age-appropriate  cancer screening -Patient was also given referral to the rare disease program at the [facility] if she prefers to have additional consultation. -We discussed trying a combination Alpha-lipoic acid and carnitine supplement for carnitine levels.  Follow-up Rtc with Dr Irene Limbo as needed No orders of the defined types were placed in this encounter.  The total time spent in the appointment was 20 minutes*.  All of the patient's questions were answered with apparent satisfaction. The patient knows to call the clinic with any problems, questions or concerns.   Sullivan Lone MD MS AAHIVMS [facility] Hematology/Oncology Physician [facility]  .*Total Encounter Time as defined by the Centers for Medicare and Medicaid Services includes, in addition to the face-to-face time of a patient visit (documented in the note above) non-face-to-face time: obtaining and reviewing outside history, ordering and reviewing medications, tests or procedures, care coordination (communications with other health care professionals or caregivers) and documentation in the medical record.  I, Melene Muller, am acting as scribe for Dr. Sullivan Lone, MD.  .I have reviewed the above documentation for accuracy and completeness, and I agree with the above. Brunetta Genera MD

## 2023-07-05 ENCOUNTER — Ambulatory Visit: Payer: Medicare Other | Admitting: Occupational Therapy

## 2023-07-05 ENCOUNTER — Ambulatory Visit: Payer: Medicare Other | Attending: Family Medicine

## 2023-07-05 DIAGNOSIS — R41841 Cognitive communication deficit: Secondary | ICD-10-CM | POA: Diagnosis present

## 2023-07-05 NOTE — Therapy (Deleted)
OUTPATIENT OCCUPATIONAL THERAPY NEURO EVALUATION  Patient Name: Nancy Barry MRN: [id number] DOB:[birth year], 52 y.o., female Today's Date: 07/05/2023  PCP: Rebecka Apley, NP  REFERRING PROVIDER: Rosita Kea, FNP   END OF SESSION:   Past Medical History:  Diagnosis Date   ADHD (attention deficit hyperactivity disorder)    Anxiety    Asthma    Chest pain    Chronic pain    Fibromyalgia    Gastroparesis    History of migraines    IBS (irritable bowel syndrome)    Kidney mass    Left   Palpitations    TIA (transient ischemic attack)    Past Surgical History:  Procedure Laterality Date   ABDOMINAL HYSTERECTOMY     TUBAL LIGATION     VESICOVAGINAL FISTULA CLOSURE W/ TAH     Patient Active Problem List   Diagnosis Date Noted   Chronic pain 02/09/2019   Fibromyalgia 02/09/2019   Decreased level of consciousness 02/09/2019   Acute encephalopathy 02/08/2019   DYSLIPIDEMIA 11/19/2009   TACHYCARDIA 11/19/2009   SHORTNESS OF BREATH 11/19/2009   PRECORDIAL PAIN 11/19/2009   RHINITIS 07/05/2007   Asthma 07/05/2007    ONSET DATE: 06/15/23 (referral date)  REFERRING DIAG:  U09.9 (ICD-10-CM) - Post covid-19 condition, unspecified  R41.89 (ICD-10-CM) - Other symptoms and signs involving cognitive functions and awareness  R41.3 (ICD-10-CM) - Other amnesia    THERAPY DIAG:  No diagnosis found.  Rationale for Evaluation and Treatment: Rehabilitation  SUBJECTIVE:   SUBJECTIVE STATEMENT: *** Pt accompanied by: {accompnied:27141}  PERTINENT HISTORY: Per 01/14/22 MD Progress Notes: ADHD, anxiety, asthma, chronic pain, fibromyalgia, hx of migraines, TIA  PRECAUTIONS: {Therapy precautions:24002}  WEIGHT BEARING RESTRICTIONS: {Yes ***/No:24003}  PAIN:  Are you having pain? {OPRCPAIN:27236}  FALLS: Has patient fallen in last 6 months? {fallsyesno:27318}  LIVING ENVIRONMENT: Lives with: {OPRC lives with:25569::"lives with their family"} Lives in:  {Lives in:25570} Stairs: {opstairs:27293} Has following equipment at home: {Assistive devices:23999}  PLOF: {PLOF:24004}  PATIENT GOALS: ***  OBJECTIVE:  Note: Objective measures were completed at Evaluation unless otherwise noted.  HAND DOMINANCE: {MISC; OT HAND [phone]}  ADLs: Overall ADLs: *** Transfers/ambulation related to ADLs: Eating: *** Grooming: *** UB Dressing: *** LB Dressing: *** Toileting: *** Bathing: *** Tub Shower transfers: *** Equipment: {equipment:25573}  IADLs: Shopping: *** Light housekeeping: *** Meal Prep: *** Community mobility: *** Medication management: *** Financial management: *** Handwriting: {OTWRITTENEXPRESSION:25361}  MOBILITY STATUS: {OTMOBILITY:25360}  POSTURE COMMENTS:  {posture:25561} Sitting balance: {sitting balance:25483}  ACTIVITY TOLERANCE: Activity tolerance: ***  FUNCTIONAL OUTCOME MEASURES: {OTFUNCTIONALMEASURES:27238}  UPPER EXTREMITY ROM:    {AROM/PROM:27142} ROM Right eval Left eval  Shoulder flexion    Shoulder abduction    Shoulder adduction    Shoulder extension    Shoulder internal rotation    Shoulder external rotation    Elbow flexion    Elbow extension    Wrist flexion    Wrist extension    Wrist ulnar deviation    Wrist radial deviation    Wrist pronation    Wrist supination    (Blank rows = not tested)  UPPER EXTREMITY MMT:     MMT Right eval Left eval  Shoulder flexion    Shoulder abduction    Shoulder adduction    Shoulder extension    Shoulder internal rotation    Shoulder external rotation    Middle trapezius    Lower trapezius    Elbow flexion    Elbow extension    Wrist flexion  Wrist extension    Wrist ulnar deviation    Wrist radial deviation    Wrist pronation    Wrist supination    (Blank rows = not tested)  HAND FUNCTION: {handfunction:27230}  COORDINATION: {otcoordination:27237}  SENSATION: {sensation:27233}  EDEMA: ***  MUSCLE TONE:  {UETONE:25567}  COGNITION: Overall cognitive status: {cognition:24006}  VISION: Subjective report: *** Baseline vision: {OTBASELINEVISION:25363} Visual history: {OTVISUALHISTORY:25364}  VISION ASSESSMENT: {visionassessment:27231}  Patient has difficulty with following activities due to following visual impairments: ***  PERCEPTION: {Perception:25564}  PRAXIS: {Praxis:25565}  OBSERVATIONS: ***   TODAY'S TREATMENT:                                                                                                                              DATE: ***   PATIENT EDUCATION: Education details: *** Person educated: {Person educated:25204} Education method: {Education Method:25205} Education comprehension: {Education Comprehension:25206}  HOME EXERCISE PROGRAM: ***   GOALS: Goals reviewed with patient? {yes/no:20286}  SHORT TERM GOALS: Target date: ***  *** Baseline: Goal status: INITIAL  2.  *** Baseline:  Goal status: INITIAL  3.  *** Baseline:  Goal status: INITIAL  4.  *** Baseline:  Goal status: INITIAL  5.  *** Baseline:  Goal status: INITIAL  6.  *** Baseline:  Goal status: INITIAL  LONG TERM GOALS: Target date: ***  *** Baseline:  Goal status: INITIAL  2.  *** Baseline:  Goal status: INITIAL  3.  *** Baseline:  Goal status: INITIAL  4.  *** Baseline:  Goal status: INITIAL  5.  *** Baseline:  Goal status: INITIAL  6.  *** Baseline:  Goal status: INITIAL  ASSESSMENT:  CLINICAL IMPRESSION: Patient is a *** y.o. *** who was seen today for occupational therapy evaluation for ***.   PERFORMANCE DEFICITS: in functional skills including {OT physical skills:25468}, cognitive skills including {OT cognitive skills:25469}, and psychosocial skills including {OT psychosocial skills:25470}.   IMPAIRMENTS: are limiting patient from {OT performance deficits:25471}.   CO-MORBIDITIES: {Comorbidities:25485} that affects occupational  performance. Patient will benefit from skilled OT to address above impairments and improve overall function.  MODIFICATION OR ASSISTANCE TO COMPLETE EVALUATION: {OT modification:25474}  OT OCCUPATIONAL PROFILE AND HISTORY: {OT PROFILE AND HISTORY:25484}  CLINICAL DECISION MAKING: {OT CDM:25475}  REHAB POTENTIAL: {rehabpotential:25112}  EVALUATION COMPLEXITY: {Evaluation complexity:25115}    PLAN:  OT FREQUENCY: {rehab frequency:25116}  OT DURATION: {rehab duration:25117}  PLANNED INTERVENTIONS: {OT Interventions:25467}  RECOMMENDED OTHER SERVICES: ***  CONSULTED AND AGREED WITH PLAN OF CARE: {VQQ:59563}  PLAN FOR NEXT SESSION: ***   Wynetta Emery, OT 07/05/2023, 7:45 AM

## 2023-07-05 NOTE — Therapy (Unsigned)
OUTPATIENT SPEECH LANGUAGE PATHOLOGY EVALUATION   Patient Name: Nancy Barry MRN: [id number] DOB:Oct 14, 1970, 52 y.o., female Today's Date: 07/06/2023  PCP: Rebecka Apley NP REFERRING PROVIDER: Rosita Kea, FNP  END OF SESSION:  End of Session - 07/05/23 1502     Visit Number 1    Number of Visits 7    Date for SLP Re-Evaluation 08/30/23   extended for scheduling   Authorization Type [employer]    SLP Start Time 1230    SLP Stop Time  1317    SLP Time Calculation (min) 47 min    Activity Tolerance Patient tolerated treatment well;Patient limited by pain;Patient limited by fatigue             Past Medical History:  Diagnosis Date   ADHD (attention deficit hyperactivity disorder)    Anxiety    Asthma    Chest pain    Chronic pain    Fibromyalgia    Gastroparesis    History of migraines    IBS (irritable bowel syndrome)    Kidney mass    Left   Palpitations    TIA (transient ischemic attack)    Past Surgical History:  Procedure Laterality Date   ABDOMINAL HYSTERECTOMY     TUBAL LIGATION     VESICOVAGINAL FISTULA CLOSURE W/ TAH     Patient Active Problem List   Diagnosis Date Noted   Chronic pain 02/09/2019   Fibromyalgia 02/09/2019   Decreased level of consciousness 02/09/2019   Acute encephalopathy 02/08/2019   DYSLIPIDEMIA 11/19/2009   TACHYCARDIA 11/19/2009   SHORTNESS OF BREATH 11/19/2009   PRECORDIAL PAIN 11/19/2009   RHINITIS 07/05/2007   Asthma 07/05/2007    ONSET DATE: 06/16/23 (referral date)   REFERRING DIAG: U09.9 (ICD-10-CM) - Post covid-19 condition, unspecified R41.89 (ICD-10-CM) - Other symptoms and signs involving cognitive functions and awareness R41.3 (ICD-10-CM) - Other amnesia  THERAPY DIAG: Cognitive communication deficit  Rationale for Evaluation and Treatment: Rehabilitation  SUBJECTIVE:   SUBJECTIVE STATEMENT: "She is harder to hear"  Pt accompanied by: significant other, Sam   PERTINENT HISTORY: "Nancy Barry  is a 52 y.o. female for evaluation of post-COVID-19 recovery needs. Patient with history of COVID-19 infection Fall 2020 and 07/2022 - no hospitalization. Past medical history of asthma, migraine, anxiety, panic disorder, fibromyalgia, chronic allergic rhinitis, CRPS Type 2 both lower extremities, and vitamin D deficiency. Since 2020 COVID-19 infection reports experiencing several symptoms including: fatigue, brain fog, loss of smell, increased migraine frequency, and congestion. "  PAIN:  Are you having pain? Yes: NPRS scale: 9/10 Pain location: feet/legs Pain description: constant  FALLS: Has patient fallen in last 6 months?  Yes, Comment: 1  LIVING ENVIRONMENT: Lives with: lives with their family Lives in: House/apartment  PLOF:  Level of assistance: Needed assistance with ADLs, Needed assistance with IADLS Employment: On disability  PATIENT GOALS: "learn some ways to be clearer to Sam and anyone else'  OBJECTIVE:  Note: Objective measures were completed at Evaluation unless otherwise noted.  COGNITION: Overall cognitive status: Impaired Areas of impairment:  Attention: Impaired: Focused, Sustained, Selective, Alternating, Divided Memory: Impaired: Working IT consultant function: Impaired: Slow processing Functional deficits: baseline ADHD & suspected auditory processing disorder; reported ongoing brain fog, forgetting fiance's work schedule despite 2-3 repetitions/day, and forgetting to remind partner of needed items such as prescriptions and groceries   COGNITIVE COMMUNICATION: Following directions: Follows one step commands inconsistently  Auditory comprehension: WFL - delayed processing, requires intermittent repetition/clarification Verbal expression: Impaired:  intermittent word finding (~4x/week), "mind goes blank" while talking, fiance reports she uses wrong words in conversation, increased garbled speech when fatigued/in pain resulting in reduced  intelligibility  ORAL MOTOR EXAMINATION: Overall status: WFL Comments: No overt deficits during OME but overall reduced facial/labial ROM exhibited in conversation  TODAY'S TREATMENT:                                                                                                                                         07/05/23: eval only   PATIENT EDUCATION: Education details: POC, possible goals Person educated: Patient and Spouse Education method: Explanation Education comprehension: verbalized understanding and needs further education   GOALS: Goals reviewed with patient? Yes  LONG TERM GOALS: Target date: 08/30/2023 (STG=LTGs; extended d/t scheduling)  Pt will employ dysarthria strategies to be 90+% intelligible in ST sessions x2 given rare min A  Baseline:  Goal status: INITIAL  2.  Pt will utilize word retrieval strategies for 2/3 opportunities in therapy given rare min A  Baseline:  Goal status: INITIAL  3.  Pt will carryover attention/processing strategies to aid focus during conversations/household tasks > 1 week   Baseline:  Goal status: INITIAL  4.  Pt will carryover memory compensations at home to aid recall for functional tasks > 1 week  Baseline:  Goal status: INITIAL  ASSESSMENT:  CLINICAL IMPRESSION: Patient is a 52 y.o. F who was seen today for cognitive changes s/p COVID-19 infection. Complex medical hx including TIA, fibromyalgia, CPRS, ADHD, depression, and anxiety. Also reported suspected baseline auditory processing disorder. Reported ongoing brain fog impacting mental clarity, short term recall deficits (ex: forgetting to tell fiance to refill prescriptions/desired grocery store items), and reduced sustained attention (ex: can only focus 10-15 mins max while reading). Significant fatigue, chronic pain and frequency/severity of migraines further complicating cognitive communication and life participation at home. Fiance, Sam, reports difficulty  understanding patient requiring repetition and clarification d/t speech errors and reduced intelligibility. Today, pt rated ~90% intelligible in conversation, which is reportedly better than typical. Anomia x1 exhibited today for common word but able to retrieve with extra time. Pt would benefit from short course of ST intervention to train cognitive communication strategies to optimize life participation.    OBJECTIVE IMPAIRMENTS: include attention, memory, and expressive language. These impairments are limiting patient from household responsibilities and effectively communicating at home and in community. Factors affecting potential to achieve goals and functional outcome are co-morbidities, cooperation/participation level, medical prognosis, pain level, and severity of impairments.. Patient will benefit from skilled SLP services to address above impairments and improve overall function.  REHAB POTENTIAL: Fair significant pain/fatigue  PLAN:  SLP FREQUENCY: 1x/week  SLP DURATION: 8 weeks  PLANNED INTERVENTIONS: [phone]- [address], Artic, Phon, Eval Compre, Express, [phone] Treatment of speech (30 or 45 min) , Language facilitation, Cueing hierachy, Cognitive reorganization, Internal/external aids, Functional tasks, Multimodal communication approach,  SLP instruction and feedback, and Compensatory strategies    Gracy Racer, CCC-SLP 07/06/2023, 7:45 AM

## 2023-07-20 ENCOUNTER — Other Ambulatory Visit (HOSPITAL_COMMUNITY): Payer: Self-pay

## 2023-07-20 MED ORDER — SAVELLA 100 MG PO TABS
100.0000 mg | ORAL_TABLET | Freq: Two times a day (BID) | ORAL | 1 refills | Status: DC
  Filled 2023-08-02: qty 180, 90d supply, fill #0
  Filled 2023-10-26: qty 60, 30d supply, fill #1

## 2023-07-21 ENCOUNTER — Other Ambulatory Visit (HOSPITAL_COMMUNITY): Payer: Self-pay

## 2023-07-26 ENCOUNTER — Ambulatory Visit: Payer: Medicare Other | Attending: Adult Health Nurse Practitioner

## 2023-07-26 ENCOUNTER — Encounter: Payer: Self-pay | Admitting: Occupational Therapy

## 2023-07-26 ENCOUNTER — Ambulatory Visit: Payer: Medicare Other | Admitting: Occupational Therapy

## 2023-07-26 DIAGNOSIS — R2681 Unsteadiness on feet: Secondary | ICD-10-CM

## 2023-07-26 DIAGNOSIS — R278 Other lack of coordination: Secondary | ICD-10-CM | POA: Insufficient documentation

## 2023-07-26 DIAGNOSIS — R41840 Attention and concentration deficit: Secondary | ICD-10-CM

## 2023-07-26 DIAGNOSIS — R29818 Other symptoms and signs involving the nervous system: Secondary | ICD-10-CM | POA: Insufficient documentation

## 2023-07-26 DIAGNOSIS — M6281 Muscle weakness (generalized): Secondary | ICD-10-CM | POA: Insufficient documentation

## 2023-07-26 DIAGNOSIS — G90523 Complex regional pain syndrome I of lower limb, bilateral: Secondary | ICD-10-CM | POA: Diagnosis present

## 2023-07-26 DIAGNOSIS — R208 Other disturbances of skin sensation: Secondary | ICD-10-CM

## 2023-07-26 NOTE — Therapy (Unsigned)
OUTPATIENT OCCUPATIONAL THERAPY NEURO EVALUATION  Patient Name: Nancy Barry MRN: [id number] DOB:09-01-1970, 52 y.o., female Today's Date: 07/26/2023  PCP: Rebecka Apley, NP REFERRING PROVIDER: Rosita Kea, FNP  END OF SESSION:  OT End of Session - 07/26/23 1317     Visit Number 1    Number of Visits 8   + evaluation   Date for OT Re-Evaluation 09/24/23    Authorization Type [employer] [facility] 2024 Auth Not Reqd VL: [place]    OT Start Time 1317    OT Stop Time 1400    OT Time Calculation (min) 43 min    Equipment Utilized During Treatment Testing Material    Activity Tolerance Patient limited by fatigue;Patient tolerated treatment well    Behavior During Therapy WFL for tasks assessed/performed             Past Medical History:  Diagnosis Date   ADHD (attention deficit hyperactivity disorder)    Anxiety    Asthma    Chest pain    Chronic pain    Fibromyalgia    Gastroparesis    History of migraines    IBS (irritable bowel syndrome)    Kidney mass    Left   Palpitations    TIA (transient ischemic attack)    Past Surgical History:  Procedure Laterality Date   ABDOMINAL HYSTERECTOMY     TUBAL LIGATION     VESICOVAGINAL FISTULA CLOSURE W/ TAH     Patient Active Problem List   Diagnosis Date Noted   Chronic pain 02/09/2019   Fibromyalgia 02/09/2019   Decreased level of consciousness 02/09/2019   Acute encephalopathy 02/08/2019   DYSLIPIDEMIA 11/19/2009   TACHYCARDIA 11/19/2009   SHORTNESS OF BREATH 11/19/2009   PRECORDIAL PAIN 11/19/2009   RHINITIS 07/05/2007   Asthma 07/05/2007    ONSET DATE: 06/16/2023  REFERRING DIAG:  U09.9 (ICD-10-CM) - Post covid-19 condition, unspecified  R41.89 (ICD-10-CM) - Other symptoms and signs involving cognitive functions and awareness  R41.3 (ICD-10-CM) - Other amnesia  THERAPY DIAG:  Muscle weakness (generalized)  Other lack of coordination  Other symptoms and signs involving the nervous system  Other  disturbances of skin sensation  Unsteadiness on feet  Attention and concentration deficit  Complex regional pain syndrome i of lower limb, bilateral  Rationale for Evaluation and Treatment: Rehabilitation  SUBJECTIVE:   SUBJECTIVE STATEMENT:  Pt accompanied by: self and significant other - Sam Tomasa Blase (fiance)  Pt reported that she had a migraine yesterday - it started in the morning yesterday and lasted through to this morning.  Pt wore sunglasses throughout session as she reported she is light sensitive often with migraines.  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  She has been through PT for feet which helped.    She reports that she is getting setup for ketamine infusion - which helps with depression, anxiety and pain  Pt arrived in her WC but fiance reports she does not use it at home as the [facility] is hard to get in/out of doors and she doesn't want to use it.  He described her ability to get around at home as "she stumbles around" ie) holds onto walls, furniture etc.  He also reports that pt is laying in 'standard' bed most of the day (90%) of the day  Pt reports that she is not able to shower often, she has to break it down because it makes her so tired.  Fiance  reports showers 1/month or 2.    PERTINENT HISTORY:  Patient with history of COVID-19 infection Fall 2020 and 07/2022 - no hospitalization. Past medical history of asthma, migraine, anxiety, panic disorder, fibromyalgia, chronic allergic rhinitis, CRPS Type 2 both lower extremities, and vitamin D deficiency. Since 2020 COVID-19 infection reports experiencing several symptoms including: fatigue, brain fog, loss of smell, increased migraine frequency, and congestion  PRECAUTIONS: None  WEIGHT BEARING RESTRICTIONS: No  PAIN:  Are you having pain? Yes: NPRS scale: 8/10 Pain location: Feet and upper legs Pain description: Varies into all the categories - stabbing, shooting, on fire,  cold/ache etc Aggravating factors: weather fronts, overexertion Relieving factors: medication, pacing self   FALLS: Has patient fallen in last 6 months? Yes. Number of falls 1 - Just got out from taking shower - exhasted, fell/collapse on her bottom ~ 6 months ago  LIVING ENVIRONMENT: Lives with: lives with their family/fiance Lives in: House Stairs: Yes: External: 3 steps; none Has following equipment at home: Single point cane, Wheelchair (manual), and shower chair  PLOF: Needs assistance with ADLs, Needs assistance with homemaking, and Needs assistance with gait Independence ~2015, not driving in past 4-5 years  PATIENT GOALS: To get stronger.  OBJECTIVE:  Note: Objective measures were completed at Evaluation unless otherwise noted.  HAND DOMINANCE: Right  ADLs: Overall ADLs: Assistance Transfers/ambulation related to ADLs: supervision to some physical assistance as needed Eating: Pt reports she I don't trust the knife.  Fiance prepares meals and brings them to her in bed.  Grooming: Able to perform aspect of this on her own. UB Dressing: Often needs help to snap bra but is rarely out of her PJs LB Dressing: Able to perform but limited participation Toileting: Mod I - does not use BSC as bathroom is short distance from bed. Bathing: showers about 1x/month or 2 for full shower, uses wipes etc to wash off otherwise Tub Shower transfers: supervision d/t fall after exiting shower Equipment: Grab bars, Walk in shower, and built in shower seat  IADLs: Pt brushes the dog. Shopping: Chauncey Fischer housekeeping: Fiance Meal Prep: Pt hasn't made meal for both of them in at least year.  She did have to fend for herself during fiance's 3 day vacation in October 2024 Community mobility: [facility] Medication management: Pt uses pill box and sorts her own meds Financial management: Pt has own bank account & manages her part of finances Handwriting:  TBA -  Pt reports it she is sometimes in a  hurry and it can be sloppy,   MOBILITY STATUS: Needs Assist: WC in public, Hx of falls, and furniture walks at home  POSTURE COMMENTS:  rounded shoulders and forward head Sitting balance: WFL in WC  ACTIVITY TOLERANCE: Activity tolerance: Limited   FUNCTIONAL OUTCOME MEASURES: Quick Dash: 56.8  UPPER EXTREMITY ROM:    Active ROM Right eval Left eval  Shoulder flexion Limited due to strength ie) decreased end range, against gravity motions  Shoulder abduction   Shoulder adduction   Shoulder extension   Shoulder internal rotation   Shoulder external rotation   Elbow flexion   Elbow extension   Wrist flexion   Wrist extension   Wrist ulnar deviation   Wrist radial deviation   Wrist pronation   Wrist supination   (Blank rows = not tested)  UPPER EXTREMITY MMT:     MMT Right eval Left eval  Shoulder flexion Generally 3/5 limited tolerance to any resistance  Shoulder abduction   Shoulder adduction  Shoulder extension   Shoulder internal rotation   Shoulder external rotation   Middle trapezius   Lower trapezius   Elbow flexion   Elbow extension   Wrist flexion   Wrist extension   Wrist ulnar deviation   Wrist radial deviation   Wrist pronation   Wrist supination   (Blank rows = not tested)  HAND FUNCTION: Grip strength: Right: 72.9, 71.2, 65.2  lbs; Left: 68.3, 70.5, 69.8 lbs Average: Right: 69.8 lbs Left: 69.5 lbs   COORDINATION: TBD 9 Hole Peg test: Right: TBA sec; Left: TBA sec - time constraints Pt and fiance report "Lots of drops" ie) dropping objects.  SENSATION: WFL - previous medication affected her feeling but reports improvement  EDEMA: Not formally assessed but some swelling in hands observed  MUSCLE TONE: Generally WFL  COGNITION: Overall cognitive status: Impaired  VISION: Subjective report: Pt reports migraines make her light sensitive and had sunglasses on during eval Baseline vision:  Glasses Visual history:  TBD  VISION  ASSESSMENT: Not tested  PERCEPTION: Not tested  PRAXIS: Not tested  OBSERVATIONS: Pt arrived in [facility] with fiance who provided assistance mobility.  Pt wore sunglasses throughout session due to light sensitivity s/p migraine yesterday.     TODAY'S TREATMENT:                                                                                                                              N/A   PATIENT EDUCATION: Education details: OT role, POC considerations AND daily routine suggestions Person educated: Patient and Caregiver fiance Education method: Explanation and Verbal cues Education comprehension: verbalized understanding, verbal cues required, and needs further education  HOME EXERCISE PROGRAM: TBD   GOALS: Goals reviewed with patient? Yes - will review specific goals  SHORT TERM GOALS: Target date: 08/24/23  Patient will demonstrate initial bed based UE HEP with 25% verbal cues or less for proper execution. Baseline: New to outpt OT Goal status: INITIAL  2.  Patient will be OOB daily for 1-2 meals x 3/5 days. Baseline: In bed 90% of the time Goal status: INITIAL  3.  *** Baseline:  Goal status: INITIAL  4.  *** Baseline:  Goal status: INITIAL  5.  *** Baseline:  Goal status: INITIAL  6.  *** Baseline:  Goal status: INITIAL  LONG TERM GOALS: Target date: 09/24/23  *** Baseline:  Goal status: INITIAL  2.  *** Baseline:  Goal status: INITIAL  3.  *** Baseline:  Goal status: INITIAL  4.  *** Baseline:  Goal status: INITIAL  5.  *** Baseline:  Goal status: INITIAL  6.  *** Baseline:  Goal status: INITIAL  ASSESSMENT:  CLINICAL IMPRESSION: Patient is a 52 y.o. female who was seen today for occupational therapy evaluation for weakness s/p long haul COVID. Hx includes asthma, migraine, anxiety, panic disorder, fibromyalgia, chronic allergic rhinitis, CRPS Type 2 both lower extremities. Patient currently presents significantly below baseline level  of function,  demonstrating functional deficits and impairments in ADLs, IADLS and functional OOB schedule/tolerance. Pt would benefit from skilled OT services in the outpatient setting to work on impairments as noted below to help pt return to PLOF as able.     PERFORMANCE DEFICITS: in functional skills including ADLs, IADLs, coordination, dexterity, sensation, edema, ROM, strength, pain, Fine motor control, Gross motor control, mobility, endurance, decreased knowledge of precautions, decreased knowledge of use of DME, and UE functional use, cognitive skills including attention, emotional, energy/drive, memory, problem solving, temperament/personality, thought, and understand, and psychosocial skills including coping strategies, environmental adaptation, habits, and routines and behaviors.   IMPAIRMENTS: are limiting patient from ADLs, IADLs, rest and sleep, work, leisure, and social participation.   CO-MORBIDITIES: has co-morbidities such as fibromyalgia  that affects occupational performance. Patient will benefit from skilled OT to address above impairments and improve overall function.  MODIFICATION OR ASSISTANCE TO COMPLETE EVALUATION: No modification of tasks or assist necessary to complete an evaluation.  OT OCCUPATIONAL PROFILE AND HISTORY: Detailed assessment: Review of records and additional review of physical, cognitive, psychosocial history related to current functional performance.  CLINICAL DECISION MAKING: LOW - limited treatment options, no task modification necessary  REHAB POTENTIAL: Fair due to chronicity, pain and limited endurance   EVALUATION COMPLEXITY: Low    PLAN:  OT FREQUENCY: 1x/week  OT DURATION: 8 weeks  PLANNED INTERVENTIONS: 97535 self care/ADL training, 72536 therapeutic exercise, 97530 therapeutic activity, 97112 neuromuscular re-education, [phone] aquatic therapy, energy conservation, coping strategies training, patient/family education, and DME and/or AE  instructions  RECOMMENDED OTHER SERVICES: Will monitor for PT needs, pt already has ST in place  CONSULTED AND AGREED WITH PLAN OF CARE: Patient and family member/caregiver  PLAN FOR NEXT SESSION:  Review specific goals Assess 9 hole peg test d/t reported 'dropping' things   Victorino Sparrow, OT 07/26/2023, 6:14 PM

## 2023-07-30 ENCOUNTER — Encounter: Payer: Self-pay | Admitting: Occupational Therapy

## 2023-07-30 NOTE — Therapy (Signed)
[facility] [address] [place], [place], [address] Phone: [phone]   Fax:  [phone]  July 30, 2023   Occupational Therapy Discharge Summary   Patient: Nancy Barry MRN: [id number] Date of Birth: 1970/12/17  OCCUPATIONAL THERAPY DISCHARGE SUMMARY  Visits from Start of Care: Evaluation only  Current functional level related to goals / functional outcomes: Pt has NOT met any goals due to evaluation only at this time.   Remaining deficits: Pt has multiple functional deficits and chronic pain.  Education / Equipment: Pt does not have any significant materials or adequate education due to no OT treatments s/p evaluation only.   Patient/significant other agrees to discharge at this time per following interaction via telephone between significant other and front office personnel:  Ms. Jodoin can be discharged. Her husband called and needed to reschedule all of her infusions because she has to have a procedure done every Monday for the month of December and all of her appointments are on Monday with [place]. When he tried to reschedule we did not have anything that would fit with what she needed, he stated that she could not come early in the day because she is in too much pain, and that he did not want her to come on Tuesdays because it is the day after the procedure. I advised that we cancel her appointments until her procedures are done he agreed until I told him that she would need to reach out to the doctor to get a new referral because it would most likely be more than 30 days since her last appointment. He stated that he is going to get a referral to be sent to another healthcare facility.    Victorino Sparrow, OT  [facility] [address] [place], [place], [address] Phone: [phone]   Fax:  [phone]  Patient: Nancy Barry MRN: [id number] Date of Birth: 27-May-1971

## 2023-08-02 ENCOUNTER — Ambulatory Visit: Payer: Medicare Other | Admitting: Occupational Therapy

## 2023-08-02 ENCOUNTER — Other Ambulatory Visit (HOSPITAL_COMMUNITY): Payer: Self-pay

## 2023-08-02 ENCOUNTER — Ambulatory Visit: Payer: Medicare Other

## 2023-08-03 ENCOUNTER — Other Ambulatory Visit (HOSPITAL_COMMUNITY): Payer: Self-pay

## 2023-08-09 ENCOUNTER — Encounter: Payer: Medicare Other | Admitting: Speech Pathology

## 2023-08-09 ENCOUNTER — Encounter: Payer: Medicare Other | Admitting: Occupational Therapy

## 2023-08-23 ENCOUNTER — Encounter: Payer: Medicare Other | Admitting: Occupational Therapy

## 2023-10-26 ENCOUNTER — Other Ambulatory Visit (HOSPITAL_COMMUNITY): Payer: Self-pay

## 2023-10-27 ENCOUNTER — Other Ambulatory Visit (HOSPITAL_COMMUNITY): Payer: Self-pay

## 2023-11-05 ENCOUNTER — Other Ambulatory Visit (HOSPITAL_BASED_OUTPATIENT_CLINIC_OR_DEPARTMENT_OTHER): Payer: Self-pay

## 2023-11-05 MED ORDER — SAVELLA 100 MG PO TABS
100.0000 mg | ORAL_TABLET | Freq: Two times a day (BID) | ORAL | 1 refills | Status: AC
  Filled 2023-11-05 – 2023-11-29 (×3): qty 180, 90d supply, fill #0
  Filled 2024-02-21: qty 180, 90d supply, fill #1

## 2023-11-29 ENCOUNTER — Other Ambulatory Visit (HOSPITAL_COMMUNITY): Payer: Self-pay

## 2024-02-09 ENCOUNTER — Other Ambulatory Visit (HOSPITAL_BASED_OUTPATIENT_CLINIC_OR_DEPARTMENT_OTHER): Payer: Self-pay

## 2024-02-09 MED ORDER — SAVELLA 100 MG PO TABS
100.0000 mg | ORAL_TABLET | Freq: Two times a day (BID) | ORAL | 1 refills | Status: DC
  Filled 2024-02-09 – 2024-05-24 (×3): qty 180, 90d supply, fill #0
  Filled 2024-08-18: qty 180, 90d supply, fill #1
  Filled 2024-08-19: qty 180, 90d supply, fill #0

## 2024-02-10 ENCOUNTER — Other Ambulatory Visit (HOSPITAL_BASED_OUTPATIENT_CLINIC_OR_DEPARTMENT_OTHER): Payer: Self-pay

## 2024-02-21 ENCOUNTER — Other Ambulatory Visit (HOSPITAL_COMMUNITY): Payer: Self-pay

## 2024-02-21 ENCOUNTER — Other Ambulatory Visit (HOSPITAL_BASED_OUTPATIENT_CLINIC_OR_DEPARTMENT_OTHER): Payer: Self-pay

## 2024-02-24 ENCOUNTER — Other Ambulatory Visit (HOSPITAL_COMMUNITY): Payer: Self-pay

## 2024-05-18 ENCOUNTER — Other Ambulatory Visit (HOSPITAL_COMMUNITY): Payer: Self-pay

## 2024-05-22 ENCOUNTER — Other Ambulatory Visit: Payer: Self-pay

## 2024-05-24 ENCOUNTER — Other Ambulatory Visit (HOSPITAL_BASED_OUTPATIENT_CLINIC_OR_DEPARTMENT_OTHER): Payer: Self-pay

## 2024-05-24 ENCOUNTER — Other Ambulatory Visit (HOSPITAL_COMMUNITY): Payer: Self-pay

## 2024-08-19 ENCOUNTER — Other Ambulatory Visit (HOSPITAL_BASED_OUTPATIENT_CLINIC_OR_DEPARTMENT_OTHER): Payer: Self-pay

## 2024-08-22 ENCOUNTER — Other Ambulatory Visit (HOSPITAL_BASED_OUTPATIENT_CLINIC_OR_DEPARTMENT_OTHER): Payer: Self-pay

## 2024-08-22 MED ORDER — SAVELLA 100 MG PO TABS
100.0000 mg | ORAL_TABLET | Freq: Two times a day (BID) | ORAL | 1 refills | Status: AC
  Filled 2024-08-22: qty 180, 90d supply, fill #0

## 2024-08-30 ENCOUNTER — Ambulatory Visit: Admitting: Neurology

## 2024-09-04 ENCOUNTER — Ambulatory Visit: Admitting: Neurology
